# Patient Record
Sex: Female | Born: 1980 | Race: White | Hispanic: No | Smoking: Never smoker
Health system: Southern US, Community
[De-identification: ages and names within clinical notes are randomized; demographics above are authoritative.]

## PROBLEM LIST (undated history)

## (undated) DIAGNOSIS — B019 Varicella without complication: Secondary | ICD-10-CM

## (undated) DIAGNOSIS — F419 Anxiety disorder, unspecified: Secondary | ICD-10-CM

## (undated) DIAGNOSIS — N390 Urinary tract infection, site not specified: Secondary | ICD-10-CM

## (undated) DIAGNOSIS — T4145XA Adverse effect of unspecified anesthetic, initial encounter: Secondary | ICD-10-CM

## (undated) DIAGNOSIS — Z87442 Personal history of urinary calculi: Secondary | ICD-10-CM

## (undated) DIAGNOSIS — N200 Calculus of kidney: Secondary | ICD-10-CM

## (undated) DIAGNOSIS — T8859XA Other complications of anesthesia, initial encounter: Secondary | ICD-10-CM

## (undated) DIAGNOSIS — J189 Pneumonia, unspecified organism: Secondary | ICD-10-CM

## (undated) DIAGNOSIS — K219 Gastro-esophageal reflux disease without esophagitis: Secondary | ICD-10-CM

## (undated) HISTORY — DX: Gastro-esophageal reflux disease without esophagitis: K21.9

## (undated) HISTORY — DX: Calculus of kidney: N20.0

## (undated) HISTORY — DX: Urinary tract infection, site not specified: N39.0

## (undated) HISTORY — PX: APPENDECTOMY: SHX54

## (undated) HISTORY — DX: Varicella without complication: B01.9

---
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
## 2008-04-07 ENCOUNTER — Emergency Department (HOSPITAL_COMMUNITY): Admission: EM | Admit: 2008-04-07 | Discharge: 2008-04-08 | Payer: Self-pay | Admitting: Emergency Medicine

## 2011-03-26 LAB — DIFFERENTIAL
Basophils Absolute: 0
Basophils Relative: 1
Eosinophils Absolute: 0.1
Eosinophils Relative: 1
Lymphocytes Relative: 33
Lymphs Abs: 2.5
Monocytes Absolute: 0.5
Monocytes Relative: 6
Neutro Abs: 4.4
Neutrophils Relative %: 58

## 2011-03-26 LAB — CBC
HCT: 39.7
Hemoglobin: 12.9
MCHC: 32.5
MCV: 82.3
Platelets: 236
RBC: 4.82
RDW: 13.4
WBC: 7.5

## 2011-03-26 LAB — URINALYSIS, ROUTINE W REFLEX MICROSCOPIC
Bilirubin Urine: NEGATIVE
Glucose, UA: NEGATIVE
Ketones, ur: NEGATIVE
Leukocytes, UA: NEGATIVE
Nitrite: NEGATIVE
Protein, ur: NEGATIVE
Specific Gravity, Urine: 1.025
Urobilinogen, UA: 1
pH: 6

## 2011-03-26 LAB — POCT I-STAT, CHEM 8
BUN: 17
Calcium, Ion: 1.18
Chloride: 106
Creatinine, Ser: 0.8
Glucose, Bld: 92
HCT: 42
Hemoglobin: 14.3
Potassium: 4.2
Sodium: 141
TCO2: 27

## 2011-03-26 LAB — URINE MICROSCOPIC-ADD ON

## 2011-03-26 LAB — PREGNANCY, URINE: Preg Test, Ur: NEGATIVE

## 2016-04-05 ENCOUNTER — Ambulatory Visit: Payer: Self-pay | Admitting: Primary Care

## 2016-04-08 ENCOUNTER — Ambulatory Visit (INDEPENDENT_AMBULATORY_CARE_PROVIDER_SITE_OTHER): Payer: Commercial Managed Care - PPO | Admitting: Primary Care

## 2016-04-08 ENCOUNTER — Encounter: Payer: Self-pay | Admitting: Primary Care

## 2016-04-08 VITALS — BP 104/64 | HR 78 | Temp 98.4°F | Ht 63.0 in | Wt 105.1 lb

## 2016-04-08 DIAGNOSIS — K228 Other specified diseases of esophagus: Secondary | ICD-10-CM | POA: Diagnosis not present

## 2016-04-08 DIAGNOSIS — K2289 Other specified disease of esophagus: Secondary | ICD-10-CM

## 2016-04-08 MED ORDER — RANITIDINE HCL 150 MG PO TABS: 150.0000 mg | ORAL_TABLET | Freq: Two times a day (BID) | ORAL | 1 refills | Status: DC

## 2016-04-08 NOTE — Assessment & Plan Note (Signed)
Ongoing for years, also with cough and throat fullness. Pain has increased within the last month. Complete evaluation per ENT and Pulmonology which were negative. GI evaluation 3 months ago, no suspicion for GERD.  Based off of the patients presentation, examination, and symptoms GERD is a likely diagnosis. Will trial Zantac 150 mg once to twice daily x 4-12 weeks. Discussed trigger foods for GERD, handout provided. Discussed to refrain from laying down within 2 hours after eating.  If no improvement, then will try PPI. May then need endoscopy for further evaluation if no improvement. No alarm signs.

## 2016-04-08 NOTE — Progress Notes (Signed)
Subjective:    Patient ID: Traci Cooper, female    DOB: 04-17-1981, 35 y.o.   MRN: [id number]  HPI  Traci Cooper is a 35 year old female who presents today to establish care and discuss the problems mentioned below. Will obtain old records. Her last physical was over 1 year ago.   1) GERD: She reports symptoms of cough, esophageal fullness, esophageal pressure for years. Over the past 3-4 weeks she's noticed increased esophageal pain both with and without ingestion of solids and liquids.   She was thought to have asthma at first and was sent to a pulmonologist and then later to ENT. She was trialed on inhalers and cough medication without improvement. She was determined to be negative for asthma. She was also evaluated by a GI specialist in [place] 3 months who didn't believe her symptoms were caused by esophageal reflux.   She's not tried OTC or prescription strength medication for her symptoms. She will experience nausea occasionally, denies vomiting. She had acupuncture done several weeks ago with temporary improvement. She is concerned as she feels as though there "are rocks in my esophagus".  Review of Systems  Constitutional: Negative for fatigue and unexpected weight change.  HENT: Negative for congestion.   Respiratory: Negative for cough, shortness of breath and wheezing.   Gastrointestinal: Positive for nausea. Negative for vomiting.       Epigastric pain, esophageal pain, fullness to throat  Neurological: Negative for weakness.       Past Medical History:  Diagnosis Date  . Chicken pox   . GERD (gastroesophageal reflux disease)   . Kidney stones   . Urinary tract infection      Social History   Social History  . Marital status: Single    Spouse name: N/A  . Number of children: N/A  . Years of education: N/A   Occupational History  . Not on file.   Social History Main Topics  . Smoking status: Never Smoker  . Smokeless tobacco: Not on file  . Alcohol use No    . Drug use: Unknown  . Sexual activity: Not on file   Other Topics Concern  . Not on file   Social History Narrative   Single.   No children.    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    Enjoys walking, Diplomatic Services operational officerphotography.     Past Surgical History:  Procedure Laterality Date  . APPENDECTOMY  1996 or 1997    Family History  Problem Relation Age of Onset  . Stroke Maternal Grandfather   . Hypertension Maternal Grandfather     Allergies  Allergen Reactions  . Erythromycin   . Tetracyclines & Related Nausea And Vomiting    No current outpatient prescriptions on file prior to visit.   No current facility-administered medications on file prior to visit.     BP 104/64   Pulse 78   Temp 98.4 F (36.9 C) (Oral)   Ht 5\' 3"  (1.6 m)   Wt 105 lb 1.9 oz (47.7 kg)   LMP 03/18/2016   SpO2 98%   BMI 18.62 kg/m    Objective:   Physical Exam  Constitutional: She appears well-nourished. She does not appear ill.  Cardiovascular: Normal rate and regular rhythm.   Pulmonary/Chest: Effort normal and breath sounds normal.  Abdominal: Soft. Bowel sounds are normal. There is tenderness in the epigastric area. There is no guarding, no tenderness at McBurney's point and negative Murphy's sign.  Skin: Skin is warm  and dry.          Assessment & Plan:

## 2016-04-08 NOTE — Patient Instructions (Signed)
Start Zantac (ranitidine) 150 mg tablets. Take 1 tablet by mouth once daily for at least weeks. You may increase to 1 tablet twice daily in 4-5 days if little to no improvement.  Please e-mail me if no improvement in your symptoms in 3-4 weeks.   Refrain from lying down within 2 hours after eating. Take a look at the trigger foods below.  Please schedule a physical with me at your earliest convenience. You may also schedule a lab only appointment 3-4 days prior. We will discuss your lab results in detail during your physical.  It was a pleasure to meet you today! Please don't hesitate to call me with any questions. Welcome to [facility]!   Gastroesophageal Reflux Disease, Adult Normally, food travels down the esophagus and stays in the stomach to be digested. However, when a person has gastroesophageal reflux disease (GERD), food and stomach acid move back up into the esophagus. When this happens, the esophagus becomes sore and inflamed. Over time, GERD can create small holes (ulcers) in the lining of the esophagus.  CAUSES This condition is caused by a problem with the muscle between the esophagus and the stomach (lower esophageal sphincter, or LES). Normally, the LES muscle closes after food passes through the esophagus to the stomach. When the LES is weakened or abnormal, it does not close properly, and that allows food and stomach acid to go back up into the esophagus. The LES can be weakened by certain dietary substances, medicines, and medical conditions, including:  Tobacco use.  Pregnancy.  Having a hiatal hernia.  Heavy alcohol use.  Certain foods and beverages, such as coffee, chocolate, onions, and peppermint. RISK FACTORS This condition is more likely to develop in:  People who have an increased body weight.  People who have connective tissue disorders.  People who use NSAID medicines. SYMPTOMS Symptoms of this condition include:  Heartburn.  Difficult or painful  swallowing.  The feeling of having a lump in the throat.  Abitter taste in the mouth.  Bad breath.  Having a large amount of saliva.  Having an upset or bloated stomach.  Belching.  Chest pain.  Shortness of breath or wheezing.  Ongoing (chronic) cough or a night-time cough.  Wearing away of tooth enamel.  Weight loss. Different conditions can cause chest pain. Make sure to see your health care provider if you experience chest pain. DIAGNOSIS Your health care provider will take a medical history and perform a physical exam. To determine if you have mild or severe GERD, your health care provider may also monitor how you respond to treatment. You may also have other tests, including:  An endoscopy toexamine your stomach and esophagus with a small camera.  A test thatmeasures the acidity level in your esophagus.  A test thatmeasures how much pressure is on your esophagus.  A barium swallow or modified barium swallow to show the shape, size, and functioning of your esophagus. TREATMENT The goal of treatment is to help relieve your symptoms and to prevent complications. Treatment for this condition may vary depending on how severe your symptoms are. Your health care provider may recommend:  Changes to your diet.  Medicine.  Surgery. HOME CARE INSTRUCTIONS Diet  Follow a diet as recommended by your health care provider. This may involve avoiding foods and drinks such as:  Coffee and tea (with or without caffeine).  Drinks that containalcohol.  Energy drinks and sports drinks.  Carbonated drinks or sodas.  Chocolate and cocoa.  Peppermint and  mint flavorings.  Garlic and onions.  Horseradish.  Spicy and acidic foods, including peppers, chili powder, curry powder, vinegar, hot sauces, and barbecue sauce.  Citrus fruit juices and citrus fruits, such as oranges, lemons, and limes.  Tomato-based foods, such as red sauce, chili, salsa, and pizza with red  sauce.  Fried and fatty foods, such as donuts, french fries, potato chips, and high-fat dressings.  High-fat meats, such as hot dogs and fatty cuts of red and white meats, such as rib eye steak, sausage, ham, and bacon.  High-fat dairy items, such as whole milk, butter, and cream cheese.  Eat small, frequent meals instead of large meals.  Avoid drinking large amounts of liquid with your meals.  Avoid eating meals during the 2-3 hours before bedtime.  Avoid lying down right after you eat.  Do not exercise right after you eat. General Instructions  Pay attention to any changes in your symptoms.  Take over-the-counter and prescription medicines only as told by your health care provider. Do not take aspirin, ibuprofen, or other NSAIDs unless your health care provider told you to do so.  Do not use any tobacco products, including cigarettes, chewing tobacco, and e-cigarettes. If you need help quitting, ask your health care provider.  Wear loose-fitting clothing. Do not wear anything tight around your waist that causes pressure on your abdomen.  Raise (elevate) the head of your bed 6 inches (15cm).  Try to reduce your stress, such as with yoga or meditation. If you need help reducing stress, ask your health care provider.  If you are overweight, reduce your weight to an amount that is healthy for you. Ask your health care provider for guidance about a safe weight loss goal.  Keep all follow-up visits as told by your health care provider. This is important. SEEK MEDICAL CARE IF:  You have new symptoms.  You have unexplained weight loss.  You have difficulty swallowing, or it hurts to swallow.  You have wheezing or a persistent cough.  Your symptoms do not improve with treatment.  You have a hoarse voice. SEEK IMMEDIATE MEDICAL CARE IF:  You have pain in your arms, neck, jaw, teeth, or back.  You feel sweaty, dizzy, or light-headed.  You have chest pain or shortness of  breath.  You vomit and your vomit looks like blood or coffee grounds.  You faint.  Your stool is bloody or black.  You cannot swallow, drink, or eat.   This information is not intended to replace advice given to you by your health care provider. Make sure you discuss any questions you have with your health care provider.   Document Released: 03/20/2005 Document Revised: 03/01/2015 Document Reviewed: 10/05/2014 Elsevier Interactive Patient Education 2016 [employer].   Food Choices for Gastroesophageal Reflux Disease, Adult When you have gastroesophageal reflux disease (GERD), the foods you eat and your eating habits are very important. Choosing the right foods can help ease the discomfort of GERD. WHAT GENERAL GUIDELINES DO I NEED TO FOLLOW?  Choose fruits, vegetables, whole grains, low-fat dairy products, and low-fat meat, fish, and poultry.  Limit fats such as oils, salad dressings, butter, nuts, and avocado.  Keep a food diary to identify foods that cause symptoms.  Avoid foods that cause reflux. These may be different for different people.  Eat frequent small meals instead of three large meals each day.  Eat your meals slowly, in a relaxed setting.  Limit fried foods.  Cook foods using methods other than frying.  Avoid drinking alcohol.  Avoid drinking large amounts of liquids with your meals.  Avoid bending over or lying down until 2-3 hours after eating. WHAT FOODS ARE NOT RECOMMENDED? The following are some foods and drinks that may worsen your symptoms: Vegetables Tomatoes. Tomato juice. Tomato and spaghetti sauce. Chili peppers. Onion and garlic. Horseradish. Fruits Oranges, grapefruit, and lemon (fruit and juice). Meats High-fat meats, fish, and poultry. This includes hot dogs, ribs, ham, sausage, salami, and bacon. Dairy Whole milk and chocolate milk. Sour cream. Cream. Butter. Ice cream. Cream cheese.  Beverages Coffee and tea, with or without  caffeine. Carbonated beverages or energy drinks. Condiments Hot sauce. Barbecue sauce.  Sweets/Desserts Chocolate and cocoa. Donuts. Peppermint and spearmint. Fats and Oils High-fat foods, including [place] fries and potato chips. Other Vinegar. Strong spices, such as black pepper, white pepper, red pepper, cayenne, curry powder, cloves, ginger, and chili powder. The items listed above may not be a complete list of foods and beverages to avoid. Contact your dietitian for more information.   This information is not intended to replace advice given to you by your health care provider. Make sure you discuss any questions you have with your health care provider.   Document Released: 06/10/2005 Document Revised: 07/01/2014 Document Reviewed: 04/14/2013 Elsevier Interactive Patient Education [employer]

## 2016-04-08 NOTE — Progress Notes (Signed)
Pre visit review using our clinic review tool, if applicable. No additional management support is needed unless otherwise documented below in the visit note. 

## 2016-04-15 ENCOUNTER — Telehealth: Payer: Self-pay | Admitting: Primary Care

## 2016-04-15 NOTE — Telephone Encounter (Signed)
-----   Message from Doreene NestKatherine K Brylie Sneath, NP sent at 04/08/2016  3:51 PM EDT ----- Regarding: GERD Any improvement in symptoms since starting Zantac (ranitidine)?

## 2016-04-15 NOTE — Telephone Encounter (Signed)
Message left for patient to return my call.  

## 2016-04-18 NOTE — Telephone Encounter (Signed)
Message left for patient to return my call.  

## 2016-05-03 ENCOUNTER — Telehealth: Payer: Self-pay | Admitting: Primary Care

## 2016-05-03 ENCOUNTER — Encounter: Payer: Self-pay | Admitting: Primary Care

## 2016-05-03 ENCOUNTER — Ambulatory Visit (INDEPENDENT_AMBULATORY_CARE_PROVIDER_SITE_OTHER): Payer: Commercial Managed Care - PPO | Admitting: Primary Care

## 2016-05-03 VITALS — BP 108/70 | HR 105 | Temp 98.3°F | Ht 63.0 in | Wt 107.1 lb

## 2016-05-03 DIAGNOSIS — K228 Other specified diseases of esophagus: Secondary | ICD-10-CM

## 2016-05-03 DIAGNOSIS — Z0000 Encounter for general adult medical examination without abnormal findings: Secondary | ICD-10-CM

## 2016-05-03 DIAGNOSIS — K2289 Other specified disease of esophagus: Secondary | ICD-10-CM

## 2016-05-03 LAB — LIPID PANEL
Cholesterol: 219 mg/dL — ABNORMAL HIGH (ref 0–200)
HDL: 81.2 mg/dL (ref >39.00)
LDL Cholesterol: 126 mg/dL — ABNORMAL HIGH (ref 0–99)
NonHDL: 137.97
Total CHOL/HDL Ratio: 3
Triglycerides: 58 mg/dL (ref 0.0–149.0)
VLDL: 11.6 mg/dL (ref 0.0–40.0)

## 2016-05-03 LAB — COMPREHENSIVE METABOLIC PANEL
ALT: 10 U/L (ref 0–35)
AST: 12 U/L (ref 0–37)
Albumin: 4.5 g/dL (ref 3.5–5.2)
Alkaline Phosphatase: 38 U/L — ABNORMAL LOW (ref 39–117)
BUN: 17 mg/dL (ref 6–23)
CO2: 30 mEq/L (ref 19–32)
Calcium: 9.6 mg/dL (ref 8.4–10.5)
Chloride: 105 mEq/L (ref 96–112)
Creatinine, Ser: 0.62 mg/dL (ref 0.40–1.20)
GFR: 116.62 mL/min (ref >60.00)
Glucose, Bld: 98 mg/dL (ref 70–99)
Potassium: 4.5 mEq/L (ref 3.5–5.1)
Sodium: 141 mEq/L (ref 135–145)
Total Bilirubin: 0.4 mg/dL (ref 0.2–1.2)
Total Protein: 7.4 g/dL (ref 6.0–8.3)

## 2016-05-03 NOTE — Assessment & Plan Note (Signed)
Improved with Zantac, although continues to experience belching, esophageal pain/burning.  Will try short term PPI x4-6 weeks. Will check on patient at that time.

## 2016-05-03 NOTE — Progress Notes (Signed)
Pre visit review using our clinic review tool, if applicable. No additional management support is needed unless otherwise documented below in the visit note. 

## 2016-05-03 NOTE — Telephone Encounter (Signed)
Noted. Please have patient take omeprazole 20 mg daily for 4 weeks. We will call and check in on her at that time.

## 2016-05-03 NOTE — Progress Notes (Signed)
Subjective:    Patient ID: Traci Cooper, female    DOB: 1981-05-22, 35 y.o.   MRN: [id number]  HPI  [name] is a 35 year old female who presents today for complete physical.  Immunizations: -Tetanus: Unsure, would like to check her records at home. -Influenza: Declines   Diet: She endorses a healthy diet. Breakfast: Smoothie Lunch: Veggie burger, pasta, vegetables Dinner: Fast food, restaurants, frozen dinner Snacks: Yogurt, cheese, nuts, fruit Desserts: Occasionally Beverages: Water, occasional hot tea  Exercise: She does not currently exercise. Eye exam: Completed 1.5 years ago Dental exam: Completes semi-annually Pap Smear: Completed 2 years ago. Normal.     Review of Systems  Constitutional: Negative for unexpected weight change.  HENT: Negative for rhinorrhea.   Respiratory: Negative for cough and shortness of breath.   Cardiovascular: Negative for chest pain.  Gastrointestinal: Negative for constipation and diarrhea.       GERD symptoms, overall improved  Genitourinary: Negative for difficulty urinating and menstrual problem.  Musculoskeletal: Negative for arthralgias and myalgias.  Skin: Negative for rash.  Allergic/Immunologic: Negative for environmental allergies.  Neurological: Negative for dizziness, numbness and headaches.  Psychiatric/Behavioral:       Denies concerns for anxiety or depression.       Past Medical History:  Diagnosis Date  . Chicken pox   . GERD (gastroesophageal reflux disease)   . Kidney stones   . Urinary tract infection      Social History   Social History  . Marital status: Single    Spouse name: N/A  . Number of children: N/A  . Years of education: N/A   Occupational History  . Not on file.   Social History Main Topics  . Smoking status: Never Smoker  . Smokeless tobacco: Not on file  . Alcohol use No  . Drug use: Unknown  . Sexual activity: Not on file   Other Topics Concern  . Not on file   Social  History Narrative   Single.   No children.    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    Enjoys walking, Diplomatic Services operational officerphotography.     Past Surgical History:  Procedure Laterality Date  . APPENDECTOMY  1996 or 1997    Family History  Problem Relation Age of Onset  . Stroke Maternal Grandfather   . Hypertension Maternal Grandfather     Allergies  Allergen Reactions  . Erythromycin   . Tetracyclines & Related Nausea And Vomiting    Current Outpatient Prescriptions on File Prior to Visit  Medication Sig Dispense Refill  . ranitidine (ZANTAC) 150 MG tablet Take 1 tablet (150 mg total) by mouth 2 (two) times daily. 60 tablet 1   No current facility-administered medications on file prior to visit.     BP 108/70   Pulse (!) 105   Temp 98.3 F (36.8 C) (Oral)   Ht 5\' 3"  (1.6 m)   Wt 107 lb 1.9 oz (48.6 kg)   LMP 03/18/2016   SpO2 95%   BMI 18.98 kg/m    Objective:   Physical Exam  Constitutional: She is oriented to person, place, and time. She appears well-nourished.  HENT:  Right Ear: Tympanic membrane and ear canal normal.  Left Ear: Tympanic membrane and ear canal normal.  Nose: Nose normal.  Mouth/Throat: Oropharynx is clear and moist.  Eyes: Conjunctivae and EOM are normal. Pupils are equal, round, and reactive to light.  Neck: Neck supple. No thyromegaly present.  Cardiovascular: Normal rate and regular  rhythm.   No murmur heard. Pulmonary/Chest: Effort normal and breath sounds normal. She has no rales.  Dry cough on exam  Abdominal: Soft. Bowel sounds are normal. There is no tenderness.  Musculoskeletal: Normal range of motion.  Lymphadenopathy:    She has no cervical adenopathy.  Neurological: She is alert and oriented to person, place, and time. She has normal reflexes. No cranial nerve deficit.  Skin: Skin is warm and dry. No rash noted.  Psychiatric: She has a normal mood and affect.          Assessment & Plan:

## 2016-05-03 NOTE — Patient Instructions (Addendum)
Stop ranitidine tablets for acid reflux. Start omeprazole 20 mg tablets for acid reflux. Take 1 tablet by mouth every day for 4 weeks.  I will call to check on you in 1 month.  Reduce fast food, processed food. Increase vegetables, fruit, whole grains.  Start exercising. You should be getting 150 minutes of moderate intensity exercise weekly.  Ensure you are consuming 64 ounces of water daily.  Follow up in 1 year for annual physical or sooner if needed.  It was a pleasure to see you today!  Food Choices for Gastroesophageal Reflux Disease, Adult When you have gastroesophageal reflux disease (GERD), the foods you eat and your eating habits are very important. Choosing the right foods can help ease the discomfort of GERD. WHAT GENERAL GUIDELINES DO I NEED TO FOLLOW?  Choose fruits, vegetables, whole grains, low-fat dairy products, and low-fat meat, fish, and poultry.  Limit fats such as oils, salad dressings, butter, nuts, and avocado.  Keep a food diary to identify foods that cause symptoms.  Avoid foods that cause reflux. These may be different for different people.  Eat frequent small meals instead of three large meals each day.  Eat your meals slowly, in a relaxed setting.  Limit fried foods.  Cook foods using methods other than frying.  Avoid drinking alcohol.  Avoid drinking large amounts of liquids with your meals.  Avoid bending over or lying down until 2-3 hours after eating. WHAT FOODS ARE NOT RECOMMENDED? The following are some foods and drinks that may worsen your symptoms: Vegetables Tomatoes. Tomato juice. Tomato and spaghetti sauce. Chili peppers. Onion and garlic. Horseradish. Fruits Oranges, grapefruit, and lemon (fruit and juice). Meats High-fat meats, fish, and poultry. This includes hot dogs, ribs, ham, sausage, salami, and bacon. Dairy Whole milk and chocolate milk. Sour cream. Cream. Butter. Ice cream. Cream cheese.  Beverages Coffee and tea,  with or without caffeine. Carbonated beverages or energy drinks. Condiments Hot sauce. Barbecue sauce.  Sweets/Desserts Chocolate and cocoa. Donuts. Peppermint and spearmint. Fats and Oils High-fat foods, including [place] fries and potato chips. Other Vinegar. Strong spices, such as black pepper, white pepper, red pepper, cayenne, curry powder, cloves, ginger, and chili powder. The items listed above may not be a complete list of foods and beverages to avoid. Contact your dietitian for more information.   This information is not intended to replace advice given to you by your health care provider. Make sure you discuss any questions you have with your health care provider.   Document Released: 06/10/2005 Document Revised: 07/01/2014 Document Reviewed: 04/14/2013 Elsevier Interactive Patient Education [employer]

## 2016-05-03 NOTE — Telephone Encounter (Signed)
Message left for patient to return my call.  

## 2016-05-03 NOTE — Assessment & Plan Note (Addendum)
She will check records for tetanus. Declines influenza. Pap UTD, due in 2018. Discussed the importance of a healthy diet and regular exercise.  Exam unremarkable. Labs pending. Will complete form. Follow up in 1 year for repeat physical.

## 2016-05-03 NOTE — Telephone Encounter (Signed)
At check out pt stated she took omeprazole 20mg   For 14 days  In feb 2017

## 2016-05-03 NOTE — Telephone Encounter (Signed)
Patient returned Chan's call.  I let patient know Kate's comments and she voiced understanding.

## 2016-05-06 ENCOUNTER — Encounter: Payer: Self-pay | Admitting: *Deleted

## 2016-06-02 ENCOUNTER — Telehealth: Payer: Self-pay | Admitting: Primary Care

## 2016-06-02 NOTE — Telephone Encounter (Signed)
-----   Message from Doreene NestKatherine K Clark, NP sent at 05/03/2016  7:58 AM EST ----- Regarding: GERD How's she doing since we initiated omeprazole 20 mg for acid reflux?

## 2016-06-04 NOTE — Telephone Encounter (Signed)
Message left for patient to return my call.  

## 2016-06-12 NOTE — Telephone Encounter (Signed)
Message left for patient to return my call.  

## 2016-09-05 ENCOUNTER — Encounter: Payer: Self-pay | Admitting: Primary Care

## 2016-09-05 ENCOUNTER — Encounter (INDEPENDENT_AMBULATORY_CARE_PROVIDER_SITE_OTHER): Payer: Self-pay

## 2016-09-05 ENCOUNTER — Ambulatory Visit (INDEPENDENT_AMBULATORY_CARE_PROVIDER_SITE_OTHER): Payer: Commercial Managed Care - PPO | Admitting: Primary Care

## 2016-09-05 VITALS — BP 106/74 | HR 88 | Temp 98.4°F | Ht 63.0 in | Wt 104.8 lb

## 2016-09-05 DIAGNOSIS — R6889 Other general symptoms and signs: Secondary | ICD-10-CM

## 2016-09-05 DIAGNOSIS — B9789 Other viral agents as the cause of diseases classified elsewhere: Secondary | ICD-10-CM

## 2016-09-05 DIAGNOSIS — J069 Acute upper respiratory infection, unspecified: Secondary | ICD-10-CM

## 2016-09-05 LAB — POCT RAPID STREP A (OFFICE): Rapid Strep A Screen: NEGATIVE

## 2016-09-05 LAB — POC INFLUENZA A&B (BINAX/QUICKVUE)
Influenza A, POC: NEGATIVE
Influenza B, POC: NEGATIVE

## 2016-09-05 NOTE — Patient Instructions (Signed)
You do not have strep throat or the flu which is good news.  Sore Throat/Fevers: You may take ibuprofen 600 mg every 8 hours as needed for pain and inflammation.  Cough: Try Delsym or Robitussin, these may be purchased over the counter.  Your symptoms should resolve on their own with time.  It was a pleasure to see you today!   Upper Respiratory Infection, Adult Most upper respiratory infections (URIs) are a viral infection of the air passages leading to the lungs. A URI affects the nose, throat, and upper air passages. The most common type of URI is nasopharyngitis and is typically referred to as "the common cold." URIs run their course and usually go away on their own. Most of the time, a URI does not require medical attention, but sometimes a bacterial infection in the upper airways can follow a viral infection. This is called a secondary infection. Sinus and middle ear infections are common types of secondary upper respiratory infections. Bacterial pneumonia can also complicate a URI. A URI can worsen asthma and chronic obstructive pulmonary disease (COPD). Sometimes, these complications can require emergency medical care and may be life threatening. What are the causes? Almost all URIs are caused by viruses. A virus is a type of germ and can spread from one person to another. What increases the risk? You may be at risk for a URI if:  You smoke.  You have chronic heart or lung disease.  You have a weakened defense (immune) system.  You are very young or very old.  You have nasal allergies or asthma.  You work in crowded or poorly ventilated areas.  You work in health care facilities or schools. What are the signs or symptoms? Symptoms typically develop 2-3 days after you come in contact with a cold virus. Most viral URIs last 7-10 days. However, viral URIs from the influenza virus (flu virus) can last 14-18 days and are typically more severe. Symptoms may include:  Runny or  stuffy (congested) nose.  Sneezing.  Cough.  Sore throat.  Headache.  Fatigue.  Fever.  Loss of appetite.  Pain in your forehead, behind your eyes, and over your cheekbones (sinus pain).  Muscle aches. How is this diagnosed? Your health care provider may diagnose a URI by:  Physical exam.  Tests to check that your symptoms are not due to another condition such as:  Strep throat.  Sinusitis.  Pneumonia.  Asthma. How is this treated? A URI goes away on its own with time. It cannot be cured with medicines, but medicines may be prescribed or recommended to relieve symptoms. Medicines may help:  Reduce your fever.  Reduce your cough.  Relieve nasal congestion. Follow these instructions at home:  Take medicines only as directed by your health care provider.  Gargle warm saltwater or take cough drops to comfort your throat as directed by your health care provider.  Use a warm mist humidifier or inhale steam from a shower to increase air moisture. This may make it easier to breathe.  Drink enough fluid to keep your urine clear or pale yellow.  Eat soups and other clear broths and maintain good nutrition.  Rest as needed.  Return to work when your temperature has returned to normal or as your health care provider advises. You may need to stay home longer to avoid infecting others. You can also use a face mask and careful hand washing to prevent spread of the virus.  Increase the usage of your inhaler  if you have asthma.  Do not use any tobacco products, including cigarettes, chewing tobacco, or electronic cigarettes. If you need help quitting, ask your health care provider. How is this prevented? The best way to protect yourself from getting a cold is to practice good hygiene.  Avoid oral or hand contact with people with cold symptoms.  Wash your hands often if contact occurs. There is no clear evidence that vitamin C, vitamin E, echinacea, or exercise reduces  the chance of developing a cold. However, it is always recommended to get plenty of rest, exercise, and practice good nutrition. Contact a health care provider if:  You are getting worse rather than better.  Your symptoms are not controlled by medicine.  You have chills.  You have worsening shortness of breath.  You have brown or red mucus.  You have yellow or brown nasal discharge.  You have pain in your face, especially when you bend forward.  You have a fever.  You have swollen neck glands.  You have pain while swallowing.  You have white areas in the back of your throat. Get help right away if:  You have severe or persistent:  Headache.  Ear pain.  Sinus pain.  Chest pain.  You have chronic lung disease and any of the following:  Wheezing.  Prolonged cough.  Coughing up blood.  A change in your usual mucus.  You have a stiff neck.  You have changes in your:  Vision.  Hearing.  Thinking.  Mood. This information is not intended to replace advice given to you by your health care provider. Make sure you discuss any questions you have with your health care provider. Document Released: 12/04/2000 Document Revised: 02/11/2016 Document Reviewed: 09/15/2013 Elsevier Interactive Patient Education  2017 [employer].

## 2016-09-05 NOTE — Progress Notes (Signed)
Pre visit review using our clinic review tool, if applicable. No additional management support is needed unless otherwise documented below in the visit note. 

## 2016-09-05 NOTE — Progress Notes (Signed)
Subjective:    Patient ID: Traci Cooper, female    DOB: 01-Oct-1980, 36 y.o.   MRN: [id number]  HPI  Traci Cooper is a 36 year old female who presents today with a chief complaint of flu-like symptoms. She reports fevers (101.2 last night), sore throat, cough, nasal congestion. She did notice white puss to the back of her throat yesterday after scraping with a q-tip. She did not get a flu shot last season. Her symptoms began with a scratchy throat last week that improved with warm tea and flu packets. Three days ago her symptoms returned with sore throat and now cough that began yesterday. She's taken OTC cough syrup, cough drops, cough and flu packets with some improvement. She denies fevers today, sick contacts.   Review of Systems  Constitutional: Positive for fatigue and fever.  HENT: Positive for congestion and sore throat. Negative for ear pain and sinus pressure.   Respiratory: Positive for cough. Negative for shortness of breath.   Cardiovascular: Negative for chest pain.       Past Medical History:  Diagnosis Date  . Chicken pox   . GERD (gastroesophageal reflux disease)   . Kidney stones   . Urinary tract infection      Social History   Social History  . Marital status: Single    Spouse name: N/A  . Number of children: N/A  . Years of education: N/A   Occupational History  . Not on file.   Social History Main Topics  . Smoking status: Never Smoker  . Smokeless tobacco: Never Used  . Alcohol use No  . Drug use: Unknown  . Sexual activity: Not on file   Other Topics Concern  . Not on file   Social History Narrative   Single.   No children.    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    Enjoys walking, Diplomatic Services operational officerphotography.     Past Surgical History:  Procedure Laterality Date  . APPENDECTOMY  1996 or 1997    Family History  Problem Relation Age of Onset  . Stroke Maternal Grandfather   . Hypertension Maternal Grandfather     Allergies  Allergen Reactions  .  Erythromycin   . Tetracyclines & Related Nausea And Vomiting    No current outpatient prescriptions on file prior to visit.   No current facility-administered medications on file prior to visit.     BP 106/74   Pulse 88   Temp 98.4 F (36.9 C) (Oral)   Ht 5\' 3"  (1.6 m)   Wt 104 lb 12.8 oz (47.5 kg)   LMP 08/28/2016   SpO2 99%   BMI 18.56 kg/m    Objective:   Physical Exam  Constitutional: She appears well-nourished. She does not appear ill.  HENT:  Right Ear: Tympanic membrane and ear canal normal.  Left Ear: Tympanic membrane and ear canal normal.  Nose: Right sinus exhibits no maxillary sinus tenderness and no frontal sinus tenderness. Left sinus exhibits no maxillary sinus tenderness and no frontal sinus tenderness.  Mouth/Throat: Posterior oropharyngeal erythema present. No oropharyngeal exudate or posterior oropharyngeal edema.  Eyes: Conjunctivae are normal.  Neck: Neck supple.  Cardiovascular: Normal rate and regular rhythm.   Pulmonary/Chest: Effort normal and breath sounds normal. She has no wheezes. She has no rales.  Lymphadenopathy:    She has no cervical adenopathy.  Skin: Skin is warm and dry.          Assessment & Plan:  Sore Throat:  Present x 3 days, also with cough, fevers.  No fever today. Improvement in symptoms temporarily with OTC treatment. Exam today with clear lungs, afebrile, no exudate to posterior pharynx. Rapid Flu: Negative Rapid Strep: Negative Discussed viral cause and to continue with symptomatic treatment OTC. Flonase, Ibuprofen, Delsym. Return precautions provided.   Traci Sheldon, NP

## 2016-09-05 NOTE — Addendum Note (Signed)
Addended by: Tawnya CrookSAMBATH, Alban Marucci on: 09/05/2016 12:20 PM   Modules accepted: Orders

## 2016-09-30 ENCOUNTER — Telehealth: Payer: Self-pay | Admitting: *Deleted

## 2016-09-30 NOTE — Telephone Encounter (Signed)
Agree, needs office visit for an antidepressant evaluation.

## 2016-09-30 NOTE — Telephone Encounter (Signed)
Pt contacted office requesting a refill of Cymbalta. Med not on current med list and pt states she has not been on medication for years. Spoke to [facility] who states pt will require an OV to restart. Advised pt who is not wanting to schedule OV and will contact previous provider.

## 2017-05-13 ENCOUNTER — Ambulatory Visit: Payer: Commercial Managed Care - PPO | Admitting: Family Medicine

## 2017-05-13 ENCOUNTER — Encounter: Payer: Self-pay | Admitting: Family Medicine

## 2017-05-13 ENCOUNTER — Ambulatory Visit: Payer: Self-pay | Admitting: *Deleted

## 2017-05-13 VITALS — BP 106/64 | HR 85 | Temp 98.9°F | Wt 101.0 lb

## 2017-05-13 DIAGNOSIS — F5102 Adjustment insomnia: Secondary | ICD-10-CM

## 2017-05-13 DIAGNOSIS — F331 Major depressive disorder, recurrent, moderate: Secondary | ICD-10-CM

## 2017-05-13 MED ORDER — ZOLPIDEM TARTRATE 10 MG PO TABS: 10.0000 mg | ORAL_TABLET | Freq: Every evening | ORAL | 1 refills | Status: DC | PRN

## 2017-05-13 MED ORDER — DULOXETINE HCL 20 MG PO CPEP: 20.0000 mg | ORAL_CAPSULE | Freq: Every day | ORAL | 3 refills | Status: DC

## 2017-05-13 NOTE — Telephone Encounter (Signed)
Patient has tried a sleep aid for 2-3 days and she was drowsy at work. She does take melatonin every night. She has tried exercise and running as well.  Reason for Disposition . Insomnia interferes with work or school  Answer Assessment - Initial Assessment Questions 1. DESCRIPTION: "Tell me about your sleeping problem."      Trouble sleeping for over 1 month- she is getting worse- didn't sleep 2 hours last night 2. ONSET: "How long have you been having trouble sleeping?" (e.g., days, weeks, months)     Over 1 month 3. RECURRENT: "Have you had sleeping problems before?"  If yes: "What happened that time?" "What helped your sleeping problem go away in the past?"      Occasional is under stress- but never this long. 4. STRESS: "Is there anything in your life that is making you feel stressed or tense?"     Patient believes this is stress related 5. PAIN: "Do you have any pain that is keeping you awake?" (e.g., back pain, headache, abdominal pain)     no 6. CAFFEINE ABUSE: "Do you drink caffeinated beverages, and how much each day?" (e.g., coffee, tea, colas)     Only in morning 7. SUBSTANCE ABUSE: "Do you use any illegal drugs or alcohol?"     no 8. OTHER SYMPTOMS: "Do you have any other symptoms?"  (e.g., difficulty breathing)     Patient has acid reflux and that has been worse recently, patient felt chills on the back of her head last night- but that normally does not happen.  Protocols used: INSOMNIA-A-AH

## 2017-05-13 NOTE — Progress Notes (Signed)
Subjective   CC:  Chief Complaint  Patient presents with  . Insomnia    due to stree "alot on her mind", tried multiple OTC med    HPI: Traci Cooper is a 36 y.o. female who presents to the office today to address the problems listed above in the chief complaint.  This is a 36 year old new patient to me who presents for same-day visit because her primary care provider is not available.  She is here due to worsening insomnia  She reports that over the last 1-2 months she has been unable to sleep well and over the last week she has been barely able to get 1-2 hours per night.  Prior to several months ago she had been a good sleeper, however, she currently is under a lot of stress.  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  She also commutes 45 minutes to 50 minutes each way to and from work.  She feels she is handling the stress well and her mood and fatigue are not affecting her performance at work.  However she also is undergoing difficulties in her long-term committed relationship.  She has been with her current partner for about 7 years and is now wanting to end the relationship.  She is a bit emotional about this.  She reports that she is safe in the home and there are no alcohol or substance abuse problems.  The relationship is just no longer in her best interest.  Her worry and concern over her strategy for leaving the relationship are keeping her up at night.  She has tried over-the-counter sedatives but they left her groggy during the daytime.  She has never been on prescription strength sedatives in the past.  She denies physical complaints such as shortness of breath snoring palpitations tremors chest pain.  Her past medical history is significant for depression-she was treated well with Cymbalta about 8 years ago.  She took this for 1-2 years and stopped after her depression remitted.  She does endorse history  of social anxiety disorder.  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  She and her partner whenever able to become pregnant.  She has been evaluated in the past feels that she is now ready to move on from this topic.  However she does have some grief regarding the issue. [place] in [place]  She does have a few friends that are supportive in the area but not a large support system. She is a high functioning adult. I reviewed the patients updated PMH, FH, and SocHx.    Patient Active Problem List   Diagnosis Date Noted  . Preventative health care 05/03/2016  . Esophageal pain 04/08/2016   Current Meds  Medication Sig  . Melatonin-Pyridoxine (MELATIN PO) Take 1 tablet by mouth at bedtime as needed.    Review of Systems: Constitutional: Negative for fever malaise or anorexia Cardiovascular: negative for chest pain Respiratory: negative for SOB or persistent  cough Gastrointestinal: negative for abdominal pain  Objective  Vitals: BP 106/64 (BP Location: Left Arm, Patient Position: Sitting, Cuff Size: Small)   Pulse 85   Temp 98.9 F (37.2 C) (Oral)   Wt 101 lb (45.8 kg)   LMP 04/29/2017   SpO2 98%   BMI 17.89 kg/m  General: no acute distress, appears tired, thin Psych:  Alert and oriented, good insight, good eye contact, normal judgment and speech. Neurologic:   Mental status is normal. normal gait  Assessment  1. Adjustment insomnia   2. Moderate episode of recurrent major depressive disorder (HCC)      Plan   20 minutes of this 25-minute  office visit was spent counseling.  We discussed her diagnosis of insomnia and depression, its etiology and prognosis, treatment options and formulated in individual care plan as documented below.  Patient insomnia is related to adjustment reaction and secondary to depression.  I recommend starting short-term Ambien to help restore her from her sleep deprivation that is now chronic.  We discussed risks, benefits and appropriate use.  We discussed proper sleep hygiene.  She will follow-up with her primary care provider to assess her insomnia in 3-4 weeks  Recurrent moderate depression-restart Cymbalta.  As this has worked for her in the past I believe it is a good choice.  We discussed onset of symptom resolution, possible side effects versus risks.  There are specific instructions in her handout.  Treating her depression will be important to help her be able to manage ending her relationship if that is what she decides to do.  Patient agrees with this care plan.  Follow up: Return in about 4 weeks (around 06/10/2017) for follow up depression and insomnia.    Commons side effects, risks, benefits, and alternatives for medications and treatment plan prescribed today were discussed, and the patient expressed understanding of the given instructions. Patient is instructed to call or message via MyChart if he/she has any questions or concerns regarding our treatment plan. No barriers to understanding were identified. We discussed Red Flag symptoms and signs in detail. Patient expressed understanding regarding what to do in case of urgent or emergency type symptoms.   Medication list was reconciled, printed and provided to the patient in AVS. Patient instructions and summary information was reviewed with the patient as documented in the AVS. This note was prepared with assistance of Dragon voice recognition software. Occasional wrong-word or sound-a-like substitutions may have occurred due to the inherent  limitations of voice recognition software  No orders of the defined types were placed in this encounter.  Meds ordered this encounter  Medications  . Melatonin-Pyridoxine (MELATIN PO)    Sig: Take 1 tablet by mouth at bedtime as needed.  . DULoxetine (CYMBALTA) 20 MG capsule    Sig: Take 1 capsule (20 mg total) by mouth daily.    Dispense:  30 capsule    Refill:  3  . zolpidem (AMBIEN) 10 MG tablet    Sig: Take 1 tablet (10 mg total) by mouth at bedtime as needed for sleep.    Dispense:  15 tablet    Refill:  1

## 2017-05-13 NOTE — Patient Instructions (Addendum)
Please look up "sleep hygiene" for ideas to improve sleep.    When you're feeling too down to do anything, try these 10 Little things!  Take a shower. Even if you plan to stay in all day long and not see a soul, take a shower. It takes the most effort to hop in to the shower but once you do, you'll feel immediate results. It will wake you up and you'll be feeling much fresher (and cleaner too).  Brush and floss your teeth. Give your teeth a good brushing with a floss finish. It's a small task but it feels so good and you can check 'taking care of your health' off the list of things to do.  Do something small on your list. Most of us have some small thing we would like to get done (load of laundry, sew a button, email a friend). Doing one of these things will make you feel like you've accomplished something.  Drink water. Drinking water is easy right? It's also really beneficial for your health so keep a glass beside you all day and take sips often. It gives you energy and prevents you from boredom eating.  Do some floor exercises. The last thing you want to do is exercise but it might be just the thing you need the most. Keep it simple and do exercises that involve sitting or laying on the floor. Even the smallest of exercises release chemicals in the brain that make you feel good. Yoga stretches or core exercises are going to make you feel good with minimal effort.  Make your bed. Making your bed takes a few minutes but it's productive and you'll feel relieved when it's done. An unmade bed is a huge visual reminder that you're having an unproductive day. Do it and consider it your housework for the day.  Put on some nice clothes. Take the sweatpants off even if you don't plan to go anywhere. Put on clothes that make you feel good. Take a look in the mirror so your brain recognizes the sweatpants have been replaced with clothes that make you look great. It's an instant confidence  booster.  Wash the dishes. A pile of dirty dishes in the sink is a reflection of your mood. It's possible that if you wash up the dishes, your mood will follow suit. It's worth a try.  Cook a real meal. If you have the luxury to have a "do nothing" day, you have time to make a real meal for yourself. Make a meal that you love to eat. The process is good to get you out of the funk and the food will ensure you have more energy for tomorrow.  Write out your thoughts by hand. When you hand write, you stimulate your brain to focus on the moment that you're in so make yourself comfortable and write whatever comes into your mind. Put those thoughts out on paper so they stop spinning around in your head. Those thoughts might be the very thing holding you down.  Insomnia Insomnia is a sleep disorder that makes it difficult to fall asleep or to stay asleep. Insomnia can cause tiredness (fatigue), low energy, difficulty concentrating, mood swings, and poor performance at work or school. There are three different ways to classify insomnia:  Difficulty falling asleep.  Difficulty staying asleep.  Waking up too early in the morning.  Any type of insomnia can be long-term (chronic) or short-term (acute). Both are common. Short-term insomnia usually lasts for three  months or less. Chronic insomnia occurs at least three times a week for longer than three months. What are the causes? Insomnia may be caused by another condition, situation, or substance, such as:  Anxiety.  Certain medicines.  Gastroesophageal reflux disease (GERD) or other gastrointestinal conditions.  Asthma or other breathing conditions.  Restless legs syndrome, sleep apnea, or other sleep disorders.  Chronic pain.  Menopause. This may include hot flashes.  Stroke.  Abuse of alcohol, tobacco, or illegal drugs.  Depression.  Caffeine.  Neurological disorders, such as Alzheimer disease.  An overactive thyroid  (hyperthyroidism).  The cause of insomnia may not be known. What increases the risk? Risk factors for insomnia include:  Gender. Women are more commonly affected than men.  Age. Insomnia is more common as you get older.  Stress. This may involve your professional or personal life.  Income. Insomnia is more common in people with lower income.  Lack of exercise.  Irregular work schedule or night shifts.  Traveling between different time zones.  What are the signs or symptoms? If you have insomnia, trouble falling asleep or trouble staying asleep is the main symptom. This may lead to other symptoms, such as:  Feeling fatigued.  Feeling nervous about going to sleep.  Not feeling rested in the morning.  Having trouble concentrating.  Feeling irritable, anxious, or depressed.  How is this treated? Treatment for insomnia depends on the cause. If your insomnia is caused by an underlying condition, treatment will focus on addressing the condition. Treatment may also include:  Medicines to help you sleep.  Counseling or therapy.  Lifestyle adjustments.  Follow these instructions at home:  Take medicines only as directed by your health care provider.  Keep regular sleeping and waking hours. Avoid naps.  Keep a sleep diary to help you and your health care provider figure out what could be causing your insomnia. Include: ? When you sleep. ? When you wake up during the night. ? How well you sleep. ? How rested you feel the next day. ? Any side effects of medicines you are taking. ? What you eat and drink.  Make your bedroom a comfortable place where it is easy to fall asleep: ? Put up shades or special blackout curtains to block light from outside. ? Use a white noise machine to block noise. ? Keep the temperature cool.  Exercise regularly as directed by your health care provider. Avoid exercising right before bedtime.  Use relaxation techniques to manage stress. Ask  your health care provider to suggest some techniques that may work well for you. These may include: ? Breathing exercises. ? Routines to release muscle tension. ? Visualizing peaceful scenes.  Cut back on alcohol, caffeinated beverages, and cigarettes, especially close to bedtime. These can disrupt your sleep.  Do not overeat or eat spicy foods right before bedtime. This can lead to digestive discomfort that can make it hard for you to sleep.  Limit screen use before bedtime. This includes: ? Watching TV. ? Using your smartphone, tablet, and computer.  Stick to a routine. This can help you fall asleep faster. Try to do a quiet activity, brush your teeth, and go to bed at the same time each night.  Get out of bed if you are still awake after 15 minutes of trying to sleep. Keep the lights down, but try reading or doing a quiet activity. When you feel sleepy, go back to bed.  Make sure that you drive carefully. Avoid driving if  you feel very sleepy.  Keep all follow-up appointments as directed by your health care provider. This is important. Contact a health care provider if:  You are tired throughout the day or have trouble in your daily routine due to sleepiness.  You continue to have sleep problems or your sleep problems get worse. Get help right away if:  You have serious thoughts about hurting yourself or someone else. This information is not intended to replace advice given to you by your health care provider. Make sure you discuss any questions you have with your health care provider. Document Released: 06/07/2000 Document Revised: 11/10/2015 Document Reviewed: 03/11/2014 Elsevier Interactive Patient Education  2018 [employer]. Insomnia Insomnia is a sleep disorder that makes it difficult to fall asleep or to stay asleep. Insomnia can cause tiredness (fatigue), low energy, difficulty concentrating, mood swings, and poor performance at work or school. There are three different  ways to classify insomnia:  Difficulty falling asleep.  Difficulty staying asleep.  Waking up too early in the morning.  Any type of insomnia can be long-term (chronic) or short-term (acute). Both are common. Short-term insomnia usually lasts for three months or less. Chronic insomnia occurs at least three times a week for longer than three months. What are the causes? Insomnia may be caused by another condition, situation, or substance, such as:  Anxiety.  Certain medicines.  Gastroesophageal reflux disease (GERD) or other gastrointestinal conditions.  Asthma or other breathing conditions.  Restless legs syndrome, sleep apnea, or other sleep disorders.  Chronic pain.  Menopause. This may include hot flashes.  Stroke.  Abuse of alcohol, tobacco, or illegal drugs.  Depression.  Caffeine.  Neurological disorders, such as Alzheimer disease.  An overactive thyroid (hyperthyroidism).  The cause of insomnia may not be known. What increases the risk? Risk factors for insomnia include:  Gender. Women are more commonly affected than men.  Age. Insomnia is more common as you get older.  Stress. This may involve your professional or personal life.  Income. Insomnia is more common in people with lower income.  Lack of exercise.  Irregular work schedule or night shifts.  Traveling between different time zones.  What are the signs or symptoms? If you have insomnia, trouble falling asleep or trouble staying asleep is the main symptom. This may lead to other symptoms, such as:  Feeling fatigued.  Feeling nervous about going to sleep.  Not feeling rested in the morning.  Having trouble concentrating.  Feeling irritable, anxious, or depressed.  How is this treated? Treatment for insomnia depends on the cause. If your insomnia is caused by an underlying condition, treatment will focus on addressing the condition. Treatment may also include:  Medicines to help you  sleep.  Counseling or therapy.  Lifestyle adjustments.  Follow these instructions at home:  Take medicines only as directed by your health care provider.  Keep regular sleeping and waking hours. Avoid naps.  Keep a sleep diary to help you and your health care provider figure out what could be causing your insomnia. Include: ? When you sleep. ? When you wake up during the night. ? How well you sleep. ? How rested you feel the next day. ? Any side effects of medicines you are taking. ? What you eat and drink.  Make your bedroom a comfortable place where it is easy to fall asleep: ? Put up shades or special blackout curtains to block light from outside. ? Use a white noise machine to block noise. ?  Keep the temperature cool.  Exercise regularly as directed by your health care provider. Avoid exercising right before bedtime.  Use relaxation techniques to manage stress. Ask your health care provider to suggest some techniques that may work well for you. These may include: ? Breathing exercises. ? Routines to release muscle tension. ? Visualizing peaceful scenes.  Cut back on alcohol, caffeinated beverages, and cigarettes, especially close to bedtime. These can disrupt your sleep.  Do not overeat or eat spicy foods right before bedtime. This can lead to digestive discomfort that can make it hard for you to sleep.  Limit screen use before bedtime. This includes: ? Watching TV. ? Using your smartphone, tablet, and computer.  Stick to a routine. This can help you fall asleep faster. Try to do a quiet activity, brush your teeth, and go to bed at the same time each night.  Get out of bed if you are still awake after 15 minutes of trying to sleep. Keep the lights down, but try reading or doing a quiet activity. When you feel sleepy, go back to bed.  Make sure that you drive carefully. Avoid driving if you feel very sleepy.  Keep all follow-up appointments as directed by your health  care provider. This is important. Contact a health care provider if:  You are tired throughout the day or have trouble in your daily routine due to sleepiness.  You continue to have sleep problems or your sleep problems get worse. Get help right away if:  You have serious thoughts about hurting yourself or someone else. This information is not intended to replace advice given to you by your health care provider. Make sure you discuss any questions you have with your health care provider. Depression Medications:  Taking the medicine as directed and not missing any doses is one of the best things you can do to treat your depression.  Here are some things to keep in mind:  1) Side effects (stomach upset, some increased anxiety) may happen before you notice a benefit.  These side effects typically go away over time. 2) Changes to your dose of medicine or a change in medication all together is sometimes necessary 3) Most people need to be on medication at least 6-12 months 4) Many people will notice an improvement within two weeks but the full effect of the medication can take up to 4-6 weeks 5) Stopping the medication when you start feeling better often results in a return of symptoms 6) If you start having thoughts of hurting yourself or others after starting this medicine, please call the office immediately at [phone].      Document Released: 06/07/2000 Document Revised: 11/10/2015 Document Reviewed: 03/11/2014 Elsevier Interactive Patient Education  [employer]2018 Elsevier Inc.

## 2017-08-06 ENCOUNTER — Encounter: Payer: Self-pay | Admitting: Emergency Medicine

## 2017-09-10 ENCOUNTER — Other Ambulatory Visit: Payer: Self-pay | Admitting: Primary Care

## 2017-09-10 DIAGNOSIS — F339 Major depressive disorder, recurrent, unspecified: Secondary | ICD-10-CM

## 2017-09-10 MED ORDER — DULOXETINE HCL 20 MG PO CPEP: 20.0000 mg | ORAL_CAPSULE | Freq: Every day | ORAL | 0 refills | Status: DC

## 2017-09-10 NOTE — Telephone Encounter (Signed)
Copied from CRM [phone]. Topic: General - Other >> Sep 10, 2017  9:05 AM [name], [name], RMA wrote: Reason for CRM: Medication refill request for DULoxetine (CYMBALTA) 20 MG [facility]

## 2017-09-10 NOTE — Telephone Encounter (Signed)
LOV 05/13/17 with Dr. Evelene CroonAndy  Katherine Clark,NP  [facility] [place]

## 2017-09-10 NOTE — Telephone Encounter (Signed)
Needs office visit for any further refills, will allow 30 day supply until she can get in. Please schedule.

## 2017-09-10 NOTE — Telephone Encounter (Signed)
Message left for patient to return my call.  

## 2017-09-10 NOTE — Telephone Encounter (Signed)
Last seen with Jae DireKate on 09/05/2016.

## 2017-09-15 NOTE — Telephone Encounter (Signed)
Office visit on 10/16/2017

## 2017-10-14 ENCOUNTER — Other Ambulatory Visit: Payer: Self-pay | Admitting: Primary Care

## 2017-10-14 DIAGNOSIS — F339 Major depressive disorder, recurrent, unspecified: Secondary | ICD-10-CM

## 2017-10-16 ENCOUNTER — Other Ambulatory Visit (HOSPITAL_COMMUNITY)
Admission: RE | Admit: 2017-10-16 | Discharge: 2017-10-16 | Disposition: A | Discharge status: Home / Self Care | Payer: Commercial Managed Care - PPO | Source: Ambulatory Visit | Attending: Primary Care | Admitting: Primary Care

## 2017-10-16 ENCOUNTER — Ambulatory Visit (INDEPENDENT_AMBULATORY_CARE_PROVIDER_SITE_OTHER): Payer: Commercial Managed Care - PPO | Admitting: Primary Care

## 2017-10-16 ENCOUNTER — Encounter: Payer: Self-pay | Admitting: Primary Care

## 2017-10-16 VITALS — BP 104/66 | HR 81 | Temp 98.3°F | Ht 63.0 in | Wt 106.5 lb

## 2017-10-16 DIAGNOSIS — Z124 Encounter for screening for malignant neoplasm of cervix: Secondary | ICD-10-CM | POA: Diagnosis not present

## 2017-10-16 DIAGNOSIS — F339 Major depressive disorder, recurrent, unspecified: Secondary | ICD-10-CM

## 2017-10-16 DIAGNOSIS — Z0000 Encounter for general adult medical examination without abnormal findings: Secondary | ICD-10-CM | POA: Diagnosis not present

## 2017-10-16 DIAGNOSIS — K228 Other specified diseases of esophagus: Secondary | ICD-10-CM | POA: Diagnosis not present

## 2017-10-16 DIAGNOSIS — K2289 Other specified disease of esophagus: Secondary | ICD-10-CM

## 2017-10-16 LAB — LIPID PANEL
Cholesterol: 242 mg/dL — ABNORMAL HIGH (ref 0–200)
HDL: 89.3 mg/dL (ref >39.00)
LDL Cholesterol: 144 mg/dL — ABNORMAL HIGH (ref 0–99)
NonHDL: 153.01
Total CHOL/HDL Ratio: 3
Triglycerides: 45 mg/dL (ref 0.0–149.0)
VLDL: 9 mg/dL (ref 0.0–40.0)

## 2017-10-16 LAB — COMPREHENSIVE METABOLIC PANEL
ALT: 9 U/L (ref 0–35)
AST: 12 U/L (ref 0–37)
Albumin: 4.5 g/dL (ref 3.5–5.2)
Alkaline Phosphatase: 36 U/L — ABNORMAL LOW (ref 39–117)
BUN: 16 mg/dL (ref 6–23)
CO2: 30 mEq/L (ref 19–32)
Calcium: 9.4 mg/dL (ref 8.4–10.5)
Chloride: 104 mEq/L (ref 96–112)
Creatinine, Ser: 0.6 mg/dL (ref 0.40–1.20)
GFR: 120.11 mL/min (ref >60.00)
Glucose, Bld: 95 mg/dL (ref 70–99)
Potassium: 4.3 mEq/L (ref 3.5–5.1)
Sodium: 138 mEq/L (ref 135–145)
Total Bilirubin: 0.3 mg/dL (ref 0.2–1.2)
Total Protein: 7.6 g/dL (ref 6.0–8.3)

## 2017-10-16 MED ORDER — DULOXETINE HCL 20 MG PO CPEP: ORAL_CAPSULE | ORAL | 3 refills | Status: DC

## 2017-10-16 NOTE — Addendum Note (Signed)
Addended by: Tawnya CrookSAMBATH, Esthefany Herrig on: 10/16/2017 08:48 AM   Modules accepted: Orders

## 2017-10-16 NOTE — Progress Notes (Signed)
Subjective:    Patient ID: Traci Cooper, female    DOB: 11/22/1980, 37 y.o.   MRN: [id number]  HPI  [name] is a 37 year old female who presents today for complete physical.  Immunizations: -Tetanus: Unsure, she will check records.  -Influenza: Did not complete last season    Diet: She endorses a healthy diet. Breakfast: Yogurt, cookies, muffin Lunch: Cafeteria food (fruit, vegetable, meat, pasta) Dinner: Take out, fast food, sometimes cooks (beans, sausage, salad, chicken) Snacks: Cheese, pretzels, yogurt Desserts: Occasionally  Beverages: Coffee, water, juice  Exercise: She is not exercising Eye exam: No recent exam Dental exam: Completes semi-annually  Pap Smear: Completed 3-4 years ago.    Review of Systems  Constitutional: Negative for unexpected weight change.  HENT: Negative for rhinorrhea.   Respiratory: Negative for cough and shortness of breath.   Cardiovascular: Negative for chest pain.  Gastrointestinal: Negative for constipation and diarrhea.  Genitourinary: Negative for difficulty urinating and menstrual problem.  Musculoskeletal: Negative for arthralgias and myalgias.  Skin: Negative for rash.  Allergic/Immunologic: Negative for environmental allergies.  Neurological: Negative for dizziness, numbness and headaches.  Psychiatric/Behavioral:       Doing well on Cymbalta. Not using Ambien       Past Medical History:  Diagnosis Date  . Chicken pox   . GERD (gastroesophageal reflux disease)   . Kidney stones   . Urinary tract infection      Social History   Socioeconomic History  . Marital status: Single    Spouse name: Not on file  . Number of children: Not on file  . Years of education: Not on file  . Highest education level: Not on file  Occupational History  . Not on file  Social Needs  . Financial resource strain: Not on file  . Food insecurity:    Worry: Not on file    Inability: Not on file  . Transportation needs:    Medical:  Not on file    Non-medical: Not on file  Tobacco Use  . Smoking status: Never Smoker  . Smokeless tobacco: Never Used  Substance and Sexual Activity  . Alcohol use: No  . Drug use: No  . Sexual activity: Not on file  Lifestyle  . Physical activity:    Days per week: Not on file    Minutes per session: Not on file  . Stress: Not on file  Relationships  . Social connections:    Talks on phone: Not on file    Gets together: Not on file    Attends religious service: Not on file    Active member of club or organization: Not on file    Attends meetings of clubs or organizations: Not on file    Relationship status: Not on file  . Intimate partner violence:    Fear of current or ex partner: Not on file    Emotionally abused: Not on file    Physically abused: Not on file    Forced sexual activity: Not on file  Other Topics Concern  . Not on file  Social History Narrative   Single.   No children.    Works in [employer] as a Education officer, museum.    Enjoys walking, Diplomatic Services operational officer.     Past Surgical History:  Procedure Laterality Date  . APPENDECTOMY  1996 or 1997    Family History  Problem Relation Age of Onset  . Stroke Maternal Grandfather   . Hypertension Maternal Grandfather  Allergies  Allergen Reactions  . Erythromycin   . Tetracyclines & Related Nausea And Vomiting    No current outpatient medications on file prior to visit.   No current facility-administered medications on file prior to visit.     BP 104/66   Pulse 81   Temp 98.3 F (36.8 C) (Oral)   Ht 5\' 3"  (1.6 m)   Wt 106 lb 8 oz (48.3 kg)   LMP 09/29/2017   SpO2 98%   BMI 18.87 kg/m    Objective:   Physical Exam  Constitutional: She is oriented to person, place, and time. She appears well-nourished.  HENT:  Right Ear: Tympanic membrane and ear canal normal.  Left Ear: Tympanic membrane and ear canal normal.  Nose: Nose normal.  Mouth/Throat: Oropharynx is clear and moist.  Eyes: Pupils are equal,  round, and reactive to light. Conjunctivae and EOM are normal.  Neck: Neck supple. No thyromegaly present.  Cardiovascular: Normal rate and regular rhythm.  No murmur heard. Pulmonary/Chest: Effort normal and breath sounds normal. She has no rales.  Abdominal: Soft. Bowel sounds are normal. There is no tenderness.  Genitourinary: There is no tenderness or lesion on the right labia. There is no tenderness or lesion on the left labia. Cervix exhibits no motion tenderness and no discharge. Right adnexum displays no tenderness. Left adnexum displays no tenderness. No erythema in the vagina. No vaginal discharge found.  Musculoskeletal: Normal range of motion.  Lymphadenopathy:    She has no cervical adenopathy.  Neurological: She is alert and oriented to person, place, and time. She has normal reflexes. No cranial nerve deficit.  Skin: Skin is warm and dry. No rash noted.  Psychiatric: She has a normal mood and affect.          Assessment & Plan:

## 2017-10-16 NOTE — Assessment & Plan Note (Signed)
Unsure of last tetanus vaccination, she would like to check her records first. Pap smear due, completed today.  Recommended to increase vegetables, fruit, whole grains, regular exercise.  Exam unremarkable. Labs pending. Follow up in 1 year.

## 2017-10-16 NOTE — Assessment & Plan Note (Addendum)
Managed on Cymbalta for which she started in November 2018. Also managed on Ambien 10 for which was given to her during that appointment in November 2018.   Doing better on Cymbalta. Not using Ambien, discouraged use.

## 2017-10-16 NOTE — Patient Instructions (Signed)
Stop by the lab prior to leaving today. I will notify you of your results once received.   We will notify you of your pap smear results once received.  Increase vegetables, fruit, whole grains, lean protein.  Ensure you are consuming 64 ounces of water daily.  Continue Cymbalta as prescribed.  Follow up in 1 year for your annual exam or sooner if needed.  It was a pleasure to see you today!   Preventive Care 18-39 Years, Female Preventive care refers to lifestyle choices and visits with your health care provider that can promote health and wellness. What does preventive care include?  A yearly physical exam. This is also called an annual well check.  Dental exams once or twice a year.  Routine eye exams. Ask your health care provider how often you should have your eyes checked.  Personal lifestyle choices, including: ? Daily care of your teeth and gums. ? Regular physical activity. ? Eating a healthy diet. ? Avoiding tobacco and drug use. ? Limiting alcohol use. ? Practicing safe sex. ? Taking vitamin and mineral supplements as recommended by your health care provider. What happens during an annual well check? The services and screenings done by your health care provider during your annual well check will depend on your age, overall health, lifestyle risk factors, and family history of disease. Counseling Your health care provider may ask you questions about your:  Alcohol use.  Tobacco use.  Drug use.  Emotional well-being.  Home and relationship well-being.  Sexual activity.  Eating habits.  Work and work Statistician.  Method of birth control.  Menstrual cycle.  Pregnancy history.  Screening You may have the following tests or measurements:  Height, weight, and BMI.  Diabetes screening. This is done by checking your blood sugar (glucose) after you have not eaten for a while (fasting).  Blood pressure.  Lipid and cholesterol levels. These may be  checked every 5 years starting at age 62.  Skin check.  Hepatitis C blood test.  Hepatitis B blood test.  Sexually transmitted disease (STD) testing.  BRCA-related cancer screening. This may be done if you have a family history of breast, ovarian, tubal, or peritoneal cancers.  Pelvic exam and Pap test. This may be done every 3 years starting at age 52. Starting at age 42, this may be done every 5 years if you have a Pap test in combination with an HPV test.  Discuss your test results, treatment options, and if necessary, the need for more tests with your health care provider. Vaccines Your health care provider may recommend certain vaccines, such as:  Influenza vaccine. This is recommended every year.  Tetanus, diphtheria, and acellular pertussis (Tdap, Td) vaccine. You may need a Td booster every 10 years.  Varicella vaccine. You may need this if you have not been vaccinated.  HPV vaccine. If you are 85 or younger, you may need three doses over 6 months.  Measles, mumps, and rubella (MMR) vaccine. You may need at least one dose of MMR. You may also need a second dose.  Pneumococcal 13-valent conjugate (PCV13) vaccine. You may need this if you have certain conditions and were not previously vaccinated.  Pneumococcal polysaccharide (PPSV23) vaccine. You may need one or two doses if you smoke cigarettes or if you have certain conditions.  Meningococcal vaccine. One dose is recommended if you are age 66-21 years and a first-year college student living in a residence hall, or if you have one of several medical  conditions. You may also need additional booster doses.  Hepatitis A vaccine. You may need this if you have certain conditions or if you travel or work in places where you may be exposed to hepatitis A.  Hepatitis B vaccine. You may need this if you have certain conditions or if you travel or work in places where you may be exposed to hepatitis B.  Haemophilus influenzae type  b (Hib) vaccine. You may need this if you have certain risk factors.  Talk to your health care provider about which screenings and vaccines you need and how often you need them. This information is not intended to replace advice given to you by your health care provider. Make sure you discuss any questions you have with your health care provider. Document Released: 08/06/2001 Document Revised: 02/28/2016 Document Reviewed: 04/11/2015 Elsevier Interactive Patient Education  [employer].

## 2017-10-16 NOTE — Assessment & Plan Note (Signed)
Overall doing well off meds, watches her diet and avoids laying down within 2 hours after eating.

## 2017-10-17 LAB — CYTOLOGY - PAP
Diagnosis: NEGATIVE
HPV: NOT DETECTED

## 2018-01-23 ENCOUNTER — Encounter: Payer: Self-pay | Admitting: Internal Medicine

## 2018-01-23 ENCOUNTER — Ambulatory Visit: Payer: Commercial Managed Care - PPO | Admitting: Internal Medicine

## 2018-01-23 VITALS — BP 106/78 | HR 81 | Temp 98.7°F | Resp 14 | Ht 63.0 in | Wt 108.2 lb

## 2018-01-23 DIAGNOSIS — L282 Other prurigo: Secondary | ICD-10-CM

## 2018-01-23 DIAGNOSIS — L259 Unspecified contact dermatitis, unspecified cause: Secondary | ICD-10-CM

## 2018-01-23 MED ORDER — METHYLPREDNISOLONE ACETATE 40 MG/ML IJ SUSP
40.0000 mg | Freq: Once | INTRAMUSCULAR | Status: AC
  Administered 2018-01-23: 40 mg via INTRAMUSCULAR

## 2018-01-23 MED ORDER — HYDROCORTISONE 2.5 % EX CREA: TOPICAL_CREAM | Freq: Two times a day (BID) | CUTANEOUS | 0 refills | Status: DC

## 2018-01-23 NOTE — Patient Instructions (Addendum)
Stop new body wash  Try Hydrocortisone 2.5 mg over your body areas broken out Try benadryl, claritin, zyrtec or allegra at night    Hives Hives (urticaria) are itchy, red, swollen areas on your skin. Hives can appear on any part of your body and can vary in size. They can be as small as the tip of a pen or much larger. Hives often fade within 24 hours (acute hives). In other cases, new hives appear after old ones fade. This cycle can continue for several days or weeks (chronic hives). Hives result from your body's reaction to an irritant or to something that you are allergic to (trigger). When you are exposed to a trigger, your body releases a chemical (histamine) that causes redness, itching, and swelling. You can get hives immediately after being exposed to a trigger or hours later. Hives do not spread from person to person (are not contagious). Your hives may get worse with scratching, exercise, and emotional stress. What are the causes? Causes of this condition include:  Allergies to certain foods or ingredients.  Insect bites or stings.  Exposure to pollen or pet dander.  Contact with latex or chemicals.  Spending time in sunlight, heat, or cold (exposure).  Exercise.  Stress.  You can also get hives from some medical conditions and treatments. These include:  Viruses, including the common cold.  Bacterial infections, such as urinary tract infections and strep throat.  Disorders such as vasculitis, lupus, or thyroid disease.  Certain medications.  Allergy shots.  Blood transfusions.  Sometimes, the cause of hives is not known (idiopathic hives). What increases the risk? This condition is more likely to develop in:  Women.  People who have food allergies, especially to citrus fruits, milk, eggs, peanuts, tree nuts, or shellfish.  People who are allergic to: ? Medicines. ? Latex. ? Insects. ? Animals. ? Pollen.  People who have certain medical conditions,  includinglupus or thyroid disease.  What are the signs or symptoms? The main symptom of this condition is raised, itchyred or white bumps or patches on your skin. These areas may:  Become large and swollen (welts).  Change in shape and location, quickly and repeatedly.  Be separate hives or connect over a large area of skin.  Sting or become painful.  Turn white when pressed in the center (blanch).  In severe cases, yourhands, feet, and face may also become swollen. This may occur if hives develop deeper in your skin. How is this diagnosed? This condition is diagnosed based on your symptoms, medical history, and physical exam. Your skin, urine, or blood may be tested to find out what is causing your hives and to rule out other health issues. Your health care provider may also remove a small sample of skin from the affected area and examine it under a microscope (biopsy). How is this treated? Treatment depends on the severity of your condition. Your health care provider may recommend using cool, wet cloths (cool compresses) or taking cool showers to relieve itching. Hives are sometimes treated with medicines, including:  Antihistamines.  Corticosteroids.  Antibiotics.  An injectable medicine (omalizumab). Your health care provider may prescribe this if you have chronic idiopathic hives and you continue to have symptoms even after treatment with antihistamines.  Severe cases may require an emergency injection of adrenaline (epinephrine) to prevent a life-threatening allergic reaction (anaphylaxis). Follow these instructions at home: Medicines  Take or apply over-the-counter and prescription medicines only as told by your health care provider.  If you were prescribed an antibiotic medicine, use it as told by your health care provider. Do not stop taking the antibiotic even if you start to feel better. Skin Care  Apply cool compresses to the affected areas.  Do not scratch or  rub your skin. General instructions  Do not take hot showers or baths. This can make itching worse.  Do not wear tight-fitting clothing.  Use sunscreen and wear protective clothing when you are outside.  Avoid any substances that cause your hives. Keep a journal to help you track what causes your hives. Write down: ? What medicines you take. ? What you eat and drink. ? What products you use on your skin.  Keep all follow-up visits as told by your health care provider. This is important. Contact a health care provider if:  Your symptoms are not controlled with medicine.  Your joints are painful or swollen. Get help right away if:  You have a fever.  You have pain in your abdomen.  Your tongue or lips are swollen.  Your eyelids are swollen.  Your chest or throat feels tight.  You have trouble breathing or swallowing. These symptoms may represent a serious problem that is an emergency. Do not wait to see if the symptoms will go away. Get medical help right away. Call your local emergency services (911 in the [place] Do not drive yourself to the hospital. This information is not intended to replace advice given to you by your health care provider. Make sure you discuss any questions you have with your health care provider. Document Released: 06/10/2005 Document Revised: 11/08/2015 Document Reviewed: 03/29/2015 Elsevier Interactive Patient Education  2018 [employer]  Contact Dermatitis Dermatitis is redness, soreness, and swelling (inflammation) of the skin. Contact dermatitis is a reaction to certain substances that touch the skin. There are two types of contact dermatitis:  Irritant contact dermatitis. This type is caused by something that irritates your skin, such as dry hands from washing them too much. This type does not require previous exposure to the substance for a reaction to occur. This type is more common.  Allergic contact dermatitis. This type is caused by a  substance that you are allergic to, such as a nickel allergy or poison ivy. This type only occurs if you have been exposed to the substance (allergen) before. Upon a repeat exposure, your body reacts to the substance. This type is less common.  What are the causes? Many different substances can cause contact dermatitis. Irritant contact dermatitis is most commonly caused by exposure to:  Makeup.  Soaps.  Detergents.  Bleaches.  Acids.  Metal salts, such as nickel.  Allergic contact dermatitis is most commonly caused by exposure to:  Poisonous plants.  Chemicals.  Jewelry.  Latex.  Medicines.  Preservatives in products, such as clothing.  What increases the risk? This condition is more likely to develop in:  People who have jobs that expose them to irritants or allergens.  People who have certain medical conditions, such as asthma or eczema.  What are the signs or symptoms? Symptoms of this condition may occur anywhere on your body where the irritant has touched you or is touched by you. Symptoms include:  Dryness or flaking.  Redness.  Cracks.  Itching.  Pain or a burning feeling.  Blisters.  Drainage of small amounts of blood or clear fluid from skin cracks.  With allergic contact dermatitis, there may also be swelling in areas such as the eyelids, mouth, or  genitals. How is this diagnosed? This condition is diagnosed with a medical history and physical exam. A patch skin test may be performed to help determine the cause. If the condition is related to your job, you may need to see an occupational medicine specialist. How is this treated? Treatment for this condition includes figuring out what caused the reaction and protecting your skin from further contact. Treatment may also include:  Steroid creams or ointments. Oral steroid medicines may be needed in more severe cases.  Antibiotics or antibacterial ointments, if a skin infection is  present.  Antihistamine lotion or an antihistamine taken by mouth to ease itching.  A bandage (dressing).  Follow these instructions at home: Skin Care  Moisturize your skin as needed.  Apply cool compresses to the affected areas.  Try taking a bath with: ? Epsom salts. Follow the instructions on the packaging. You can get these at your local pharmacy or grocery store. ? Baking soda. Pour a small amount into the bath as directed by your health care provider. ? Colloidal oatmeal. Follow the instructions on the packaging. You can get this at your local pharmacy or grocery store.  Try applying baking soda paste to your skin. Stir water into baking soda until it reaches a paste-like consistency.  Do not scratch your skin.  Bathe less frequently, such as every other day.  Bathe in lukewarm water. Avoid using hot water. Medicines  Take or apply over-the-counter and prescription medicines only as told by your health care provider.  If you were prescribed an antibiotic medicine, take or apply your antibiotic as told by your health care provider. Do not stop using the antibiotic even if your condition starts to improve. General instructions  Keep all follow-up visits as told by your health care provider. This is important.  Avoid the substance that caused your reaction. If you do not know what caused it, keep a journal to try to track what caused it. Write down: ? What you eat. ? What cosmetic products you use. ? What you drink. ? What you wear in the affected area. This includes jewelry.  If you were given a dressing, take care of it as told by your health care provider. This includes when to change and remove it. Contact a health care provider if:  Your condition does not improve with treatment.  Your condition gets worse.  You have signs of infection such as swelling, tenderness, redness, soreness, or warmth in the affected area.  You have a fever.  You have new  symptoms. Get help right away if:  You have a severe headache, neck pain, or neck stiffness.  You vomit.  You feel very sleepy.  You notice red streaks coming from the affected area.  Your bone or joint underneath the affected area becomes painful after the skin has healed.  The affected area turns darker.  You have difficulty breathing. This information is not intended to replace advice given to you by your health care provider. Make sure you discuss any questions you have with your health care provider. Document Released: 06/07/2000 Document Revised: 11/16/2015 Document Reviewed: 10/26/2014 Elsevier Interactive Patient Education  2018 [employer] Inc[employer]    Hives Hives (urticaria) are itchy, red, swollen areas on your skin. Hives can appear on any part of your body and can vary in size. They can be as small as the tip of a pen or much larger. Hives often fade within 24 hours (acute hives). In other cases, new hives appear  after old ones fade. This cycle can continue for several days or weeks (chronic hives). Hives result from your body's reaction to an irritant or to something that you are allergic to (trigger). When you are exposed to a trigger, your body releases a chemical (histamine) that causes redness, itching, and swelling. You can get hives immediately after being exposed to a trigger or hours later. Hives do not spread from person to person (are not contagious). Your hives may get worse with scratching, exercise, and emotional stress. What are the causes? Causes of this condition include:  Allergies to certain foods or ingredients.  Insect bites or stings.  Exposure to pollen or pet dander.  Contact with latex or chemicals.  Spending time in sunlight, heat, or cold (exposure).  Exercise.  Stress.  You can also get hives from some medical conditions and treatments. These include:  Viruses, including the common cold.  Bacterial infections, such as urinary tract  infections and strep throat.  Disorders such as vasculitis, lupus, or thyroid disease.  Certain medications.  Allergy shots.  Blood transfusions.  Sometimes, the cause of hives is not known (idiopathic hives). What increases the risk? This condition is more likely to develop in:  Women.  People who have food allergies, especially to citrus fruits, milk, eggs, peanuts, tree nuts, or shellfish.  People who are allergic to: ? Medicines. ? Latex. ? Insects. ? Animals. ? Pollen.  People who have certain medical conditions, includinglupus or thyroid disease.  What are the signs or symptoms? The main symptom of this condition is raised, itchyred or white bumps or patches on your skin. These areas may:  Become large and swollen (welts).  Change in shape and location, quickly and repeatedly.  Be separate hives or connect over a large area of skin.  Sting or become painful.  Turn white when pressed in the center (blanch).  In severe cases, yourhands, feet, and face may also become swollen. This may occur if hives develop deeper in your skin. How is this diagnosed? This condition is diagnosed based on your symptoms, medical history, and physical exam. Your skin, urine, or blood may be tested to find out what is causing your hives and to rule out other health issues. Your health care provider may also remove a small sample of skin from the affected area and examine it under a microscope (biopsy). How is this treated? Treatment depends on the severity of your condition. Your health care provider may recommend using cool, wet cloths (cool compresses) or taking cool showers to relieve itching. Hives are sometimes treated with medicines, including:  Antihistamines.  Corticosteroids.  Antibiotics.  An injectable medicine (omalizumab). Your health care provider may prescribe this if you have chronic idiopathic hives and you continue to have symptoms even after treatment with  antihistamines.  Severe cases may require an emergency injection of adrenaline (epinephrine) to prevent a life-threatening allergic reaction (anaphylaxis). Follow these instructions at home: Medicines  Take or apply over-the-counter and prescription medicines only as told by your health care provider.  If you were prescribed an antibiotic medicine, use it as told by your health care provider. Do not stop taking the antibiotic even if you start to feel better. Skin Care  Apply cool compresses to the affected areas.  Do not scratch or rub your skin. General instructions  Do not take hot showers or baths. This can make itching worse.  Do not wear tight-fitting clothing.  Use sunscreen and wear protective clothing when you are  outside.  Avoid any substances that cause your hives. Keep a journal to help you track what causes your hives. Write down: ? What medicines you take. ? What you eat and drink. ? What products you use on your skin.  Keep all follow-up visits as told by your health care provider. This is important. Contact a health care provider if:  Your symptoms are not controlled with medicine.  Your joints are painful or swollen. Get help right away if:  You have a fever.  You have pain in your abdomen.  Your tongue or lips are swollen.  Your eyelids are swollen.  Your chest or throat feels tight.  You have trouble breathing or swallowing. These symptoms may represent a serious problem that is an emergency. Do not wait to see if the symptoms will go away. Get medical help right away. Call your local emergency services (911 in the [place] Do not drive yourself to the hospital. This information is not intended to replace advice given to you by your health care provider. Make sure you discuss any questions you have with your health care provider. Document Released: 06/10/2005 Document Revised: 11/08/2015 Document Reviewed: 03/29/2015 Elsevier Interactive Patient  Education  2018 [employer]

## 2018-01-23 NOTE — Addendum Note (Signed)
Addended by: Sandy SalaamWOODWARD, Mahasin Riviere on: 01/23/2018 03:23 PM   Modules accepted: Orders

## 2018-01-23 NOTE — Progress Notes (Signed)
Chief Complaint  Patient presents with  . Acute Visit    rash on arms   Tuesday/Weds started using Lush cosmetics body wash and yesterday am had rash on face, arms, truck itchy esp with heat and clothes feeling well otherwise no fever cough. Feels like something crawling on skin.    Review of Systems  Constitutional: Negative for fever.  Respiratory: Negative for cough.   Cardiovascular: Negative for chest pain.  Skin: Positive for rash.   Past Medical History:  Diagnosis Date  . Chicken pox   . GERD (gastroesophageal reflux disease)   . Kidney stones   . Urinary tract infection    Past Surgical History:  Procedure Laterality Date  . APPENDECTOMY  1996 or 1997   Family History  Problem Relation Age of Onset  . Stroke Maternal Grandfather   . Hypertension Maternal Grandfather    Social History   Socioeconomic History  . Marital status: Single    Spouse name: Not on file  . Number of children: Not on file  . Years of education: Not on file  . Highest education level: Not on file  Occupational History  . Not on file  Social Needs  . Financial resource strain: Not on file  . Food insecurity:    Worry: Not on file    Inability: Not on file  . Transportation needs:    Medical: Not on file    Non-medical: Not on file  Tobacco Use  . Smoking status: Never Smoker  . Smokeless tobacco: Never Used  Substance and Sexual Activity  . Alcohol use: No  . Drug use: No  . Sexual activity: Not on file  Lifestyle  . Physical activity:    Days per week: Not on file    Minutes per session: Not on file  . Stress: Not on file  Relationships  . Social connections:    Talks on phone: Not on file    Gets together: Not on file    Attends religious service: Not on file    Active member of club or organization: Not on file    Attends meetings of clubs or organizations: Not on file    Relationship status: Not on file  . Intimate partner violence:    Fear of current or ex  partner: Not on file    Emotionally abused: Not on file    Physically abused: Not on file    Forced sexual activity: Not on file  Other Topics Concern  . Not on file  Social History Narrative   Single.   No children.    Works in [employer] as a Education officer, museum.    Enjoys walking, Diplomatic Services operational officer.    [place]    Current Meds  Medication Sig  . DULoxetine (CYMBALTA) 20 MG capsule TAKE 1 CAPSULE BY MOUTH EVERY DAY   Allergies  Allergen Reactions  . Erythromycin   . Tetracyclines & Related Nausea And Vomiting   No results found for this or any previous visit (from the past 2160 hour(s)). Objective  Body mass index is 19.17 kg/m. Wt Readings from Last 3 Encounters:  01/23/18 108 lb 3.2 oz (49.1 kg)  10/16/17 106 lb 8 oz (48.3 kg)  05/13/17 101 lb (45.8 kg)   Temp Readings from Last 3 Encounters:  01/23/18 98.7 F (37.1 C) (Oral)  10/16/17 98.3 F (36.8 C) (Oral)  05/13/17 98.9 F (37.2 C) (Oral)   BP Readings from Last 3 Encounters:  01/23/18 106/78  10/16/17 104/66  05/13/17 106/64   Pulse Readings from Last 3 Encounters:  01/23/18 81  10/16/17 81  05/13/17 85    Physical Exam  Constitutional: She is oriented to person, place, and time. Vital signs are normal. She appears well-developed and well-nourished. She is cooperative.  HENT:  Head: Normocephalic and atraumatic.  Mouth/Throat: Oropharynx is clear and moist and mucous membranes are normal.  Eyes: Pupils are equal, round, and reactive to light. Conjunctivae are normal.  Cardiovascular: Normal rate, regular rhythm and normal heart sounds.  Pulmonary/Chest: Effort normal and breath sounds normal.  Neurological: She is alert and oriented to person, place, and time. Gait normal.  Skin: Skin is warm, dry and intact. Rash noted.  Fine papules to face, arms, trunk ? Urticaria vs contact dermatitis    Psychiatric: She has a normal mood and affect. Her speech is normal and behavior is normal. Judgment and thought  content normal. Cognition and memory are normal.  Nursing note and vitals reviewed.   Assessment   1. Papular urticaria vs contact 2/2 new body wash  Plan  1. Depo 40 x 1  otc antihistamine rec and hc 2.5 bid prn  F/u with PCP next week if not better  Provider: Dr. French Anaracy [facility]

## 2018-01-25 ENCOUNTER — Other Ambulatory Visit: Payer: Self-pay | Admitting: Primary Care

## 2018-01-25 DIAGNOSIS — F339 Major depressive disorder, recurrent, unspecified: Secondary | ICD-10-CM

## 2018-03-04 ENCOUNTER — Other Ambulatory Visit: Payer: Self-pay | Admitting: Primary Care

## 2018-03-04 DIAGNOSIS — F339 Major depressive disorder, recurrent, unspecified: Secondary | ICD-10-CM

## 2018-03-05 ENCOUNTER — Ambulatory Visit: Payer: Commercial Managed Care - PPO | Admitting: Family Medicine

## 2018-03-05 ENCOUNTER — Encounter: Payer: Self-pay | Admitting: Family Medicine

## 2018-03-05 VITALS — BP 118/64 | HR 89 | Temp 98.3°F | Ht 63.0 in | Wt 106.2 lb

## 2018-03-05 DIAGNOSIS — F339 Major depressive disorder, recurrent, unspecified: Secondary | ICD-10-CM | POA: Diagnosis not present

## 2018-03-05 DIAGNOSIS — A084 Viral intestinal infection, unspecified: Secondary | ICD-10-CM | POA: Diagnosis not present

## 2018-03-05 MED ORDER — PROMETHAZINE HCL 25 MG PO TABS: 25.0000 mg | ORAL_TABLET | Freq: Three times a day (TID) | ORAL | 0 refills | Status: DC | PRN

## 2018-03-05 NOTE — Progress Notes (Signed)
Subjective:    Patient ID: Traci Cooper, female    DOB: April 18, 1981, 37 y.o.   MRN: [id number]  HPI 37 yo pt of NP Clark here for diarrhea (with fatigue and dizziness)   Also stress issues   Wt Readings from Last 3 Encounters:  03/05/18 106 lb 4 oz (48.2 kg)  01/23/18 108 lb 3.2 oz (49.1 kg)  10/16/17 106 lb 8 oz (48.3 kg)   18.82 kg/m   Symptoms started Thursday evening  Had chills /felt like a fever - 37.6 C - low grade  Chills were on and off (aspirin/ cold and flu med otc)  By Sunday started GI symptoms  Got nauseated (never vomited)   Then diarrhea - very watery , no blood - bm at least every 2 hours (worse if she ate something)  Was able to drink fluids  Drinking water with apple cider vinegar- this helped (1 1/2 d ago) No more fever  No diarrhea for 24 hours  Stomach is queasy  After she eats feels urge to have bm - but no bm  She did take some pills (? Immodium) otc and a syrup (? What it was)   Very tired and weak  Mouth - was dry/that is improved  A little dizzy as well  BP Readings from Last 3 Encounters:  03/05/18 118/64  01/23/18 106/78  10/16/17 104/66   Pulse Readings from Last 3 Encounters:  03/05/18 89  01/23/18 81  10/16/17 81     Has also been under tremendous stress  She tends not to talk about it  No end point to it  Has been trying to leave her boyfriend on and off over a year (wants to move out)  He is in denial  They have been together for 8 years  Fertility issues -she wanted a child (nothing wrong with her and he would not go to get checked out)   Wants to get out of her situation  Does not have family here / does have a good friend   Works in [employer]- has a good job   Mother moved to [place] - she would like to go visit  She would like to go for 2 weeks   Taking cymbalta for her depression - and it does help  Was suicidal in the past- not now    Patient Active Problem List   Diagnosis Date Noted  . Viral gastroenteritis  03/05/2018  . Episode of recurrent major depressive disorder (HCC) 10/16/2017  . Preventative health care 05/03/2016  . Esophageal pain 04/08/2016   Past Medical History:  Diagnosis Date  . Chicken pox   . GERD (gastroesophageal reflux disease)   . Kidney stones   . Urinary tract infection    Past Surgical History:  Procedure Laterality Date  . APPENDECTOMY  1996 or 1997   Social History   Tobacco Use  . Smoking status: Never Smoker  . Smokeless tobacco: Never Used  Substance Use Topics  . Alcohol use: No  . Drug use: No   Family History  Problem Relation Age of Onset  . Stroke Maternal Grandfather   . Hypertension Maternal Grandfather    Allergies  Allergen Reactions  . Erythromycin   . Tetracyclines & Related Nausea And Vomiting   Current Outpatient Medications on File Prior to Visit  Medication Sig Dispense Refill  . DULoxetine (CYMBALTA) 20 MG capsule TAKE 1 CAPSULE BY MOUTH EVERY DAY 90 capsule 1   No current facility-administered medications on  file prior to visit.      Review of Systems  Constitutional: Positive for chills, fatigue and fever. Negative for activity change, appetite change and unexpected weight change.  HENT: Negative for congestion, ear pain, rhinorrhea, sinus pressure and sore throat.   Eyes: Negative for pain, redness and visual disturbance.  Respiratory: Negative for cough, shortness of breath and wheezing.   Cardiovascular: Negative for chest pain and palpitations.  Gastrointestinal: Positive for diarrhea and nausea. Negative for abdominal distention, abdominal pain, anal bleeding, blood in stool, constipation, rectal pain and vomiting.       GERD-baseline   Endocrine: Negative for polydipsia and polyuria.  Genitourinary: Negative for decreased urine volume, dysuria, frequency and urgency.  Musculoskeletal: Negative for arthralgias, back pain and myalgias.  Skin: Negative for pallor and rash.  Allergic/Immunologic: Negative for  environmental allergies.  Neurological: Positive for light-headedness. Negative for dizziness, syncope and headaches.  Hematological: Negative for adenopathy. Does not bruise/bleed easily.  Psychiatric/Behavioral: Positive for decreased concentration and dysphoric mood. Negative for self-injury, sleep disturbance and suicidal ideas. The patient is nervous/anxious.        Objective:   Physical Exam  Constitutional: She appears well-developed and well-nourished. No distress.  Fatigued appearing female /slim to underweight   HENT:  Head: Normocephalic and atraumatic.  Mouth/Throat: Oropharynx is clear and moist.  MMM  Eyes: Pupils are equal, round, and reactive to light. Conjunctivae and EOM are normal. Right eye exhibits no discharge. Left eye exhibits no discharge. No scleral icterus.  Neck: Normal range of motion. Neck supple.  Cardiovascular: Normal rate, regular rhythm and normal heart sounds.  Pulmonary/Chest: Effort normal and breath sounds normal. No respiratory distress. She has no wheezes. She has no rales.  Abdominal: Soft. Bowel sounds are normal. She exhibits no distension and no mass. There is no tenderness. There is no rebound and no guarding. No hernia.  No abd tenderness   bs heard in 4 Q No high pitched sounds   Lymphadenopathy:    She has no cervical adenopathy.  Neurological: She is alert. She displays normal reflexes. No cranial nerve deficit. Coordination normal.  Skin: Skin is warm and dry. No rash noted. No erythema. No pallor.  Psychiatric: Her speech is normal and behavior is normal. Her affect is not blunt and not labile. Thought content is not paranoid. She exhibits a depressed mood. She expresses no homicidal and no suicidal ideation.  Seems fatigued and depressed           Assessment & Plan:   Problem List Items Addressed This Visit      Digestive   Viral gastroenteritis - Primary    Since last Thursday with fever and diarrhea and nausea  Starting  to improve Able to drink fluids Px phenergan for nausea  Antic guidance re: rest /fluids and how quickly to expect fatigue and weakness to improve Update if not starting to improve in a week or if worsening   Out of work until wed -note written  Disc fluid intake and BRAT diet         Other   Episode of recurrent major depressive disorder (HCC)    Worse lately I face of severe stressors- needs to move out of her apt with boyfriend  Would like to go to [place] to spend a few weeks with her mom and then plan getting a new apartment /moving Very 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

## 2018-03-05 NOTE — Patient Instructions (Addendum)
Keep drinking fluids Rest as much as you can  When ready to eat - stay very bland ( bananas/ apple sauce/rice/toast) ---Charissa BashBRAT diet   Acetaminophen for fever or chills is ok over the counter (tylenol)  Update if not starting to improve in a week or if worsening    I put in a referral for counseling

## 2018-03-05 NOTE — Assessment & Plan Note (Signed)
Worse lately I face of severe stressors- needs to move out of her apt with boyfriend  Would like to go to [place] to spend a few weeks with her mom and then plan getting a new apartment /moving Very concerned she will loose her job  Community education officerContinues cymbalta which is very helpful  Open to counseling - (there are time constraints) - ref done  Will plan f/u with her PCP asap  No SI -inst to let us know if this changes Reviewed stressors/ coping techniques/symptoms/ support sources/ tx options and side effects in detail today

## 2018-03-05 NOTE — Assessment & Plan Note (Signed)
Since last Thursday with fever and diarrhea and nausea  Starting to improve Able to drink fluids Px phenergan for nausea  Antic guidance re: rest /fluids and how quickly to expect fatigue and weakness to improve Update if not starting to improve in a week or if worsening   Out of work until wed -note written  Disc fluid intake and BRAT diet

## 2018-03-09 ENCOUNTER — Telehealth: Payer: Self-pay | Admitting: Primary Care

## 2018-03-09 NOTE — Telephone Encounter (Signed)
Given to [name] 

## 2018-03-09 NOTE — Telephone Encounter (Signed)
[employer] faxed short term disability paperwork In dr tower in box

## 2018-03-09 NOTE — Telephone Encounter (Signed)
Done and in IN box 

## 2018-03-10 NOTE — Telephone Encounter (Signed)
Pt aware.

## 2018-03-10 NOTE — Telephone Encounter (Signed)
Paperwork faxed °Copy for pt °Copy for scan °

## 2018-03-11 ENCOUNTER — Ambulatory Visit: Payer: Commercial Managed Care - PPO | Admitting: Primary Care

## 2018-03-11 ENCOUNTER — Encounter: Payer: Self-pay | Admitting: Primary Care

## 2018-03-11 VITALS — BP 124/76 | HR 88 | Temp 98.6°F | Ht 63.0 in | Wt 105.8 lb

## 2018-03-11 DIAGNOSIS — F411 Generalized anxiety disorder: Secondary | ICD-10-CM | POA: Diagnosis not present

## 2018-03-11 DIAGNOSIS — R197 Diarrhea, unspecified: Secondary | ICD-10-CM | POA: Diagnosis not present

## 2018-03-11 DIAGNOSIS — F331 Major depressive disorder, recurrent, moderate: Secondary | ICD-10-CM

## 2018-03-11 DIAGNOSIS — A084 Viral intestinal infection, unspecified: Secondary | ICD-10-CM | POA: Diagnosis not present

## 2018-03-11 MED ORDER — DULOXETINE HCL 30 MG PO CPEP: 30.0000 mg | ORAL_CAPSULE | Freq: Every day | ORAL | 0 refills | Status: DC

## 2018-03-11 NOTE — Progress Notes (Signed)
Subjective:    Patient ID: Traci Cooper, female    DOB: 10-23-1980, 37 y.o.   MRN: [id number]  HPI  Traci Cooper is a 37 year old female who presents today for follow up.  She was last evaluated on 03/05/18 with complaints of diarrhea, nausea, fevers, dizziness. Admitted to being under a lot of stress, especially regarding her relationship with her boyfriend. They have been together for 8 years and are havnig some fertility issues. She is trying to leave her boyfriend. She was diagnosed with viral gastritis and encouraged to follow up with PCP.   She continues to experience chills, sweats, nausea, diarrhea. She endorses a history of diarrhea and these symptoms since she was a teenager. She ran a fever of 99.9 four days ago, no fevers since. She isn't eating much as she will have diarrhea right after, she is drinking juice and water. Overall she's feeling slightly better but not much. She isn't sure if she actually had a viral infection or if her symptoms are due to stress.   She is currently managed on duloxetine for depression which has helped for depression overall. She was once managed on duloxetine 60 mg years ago and did well.   Her stress comes from both work and her personal life. She's trying to leave her boyfriend and is looking for a place to live. She endorses a lot of work stress and is considering quitting. She has no family locally and is with a friend here today. She would like to visit her mother in [place] and would like a leave of absence from work in order to reduce symptoms and rest. She is requesting a leave for 4 weeks which can be done through [employer]. She has an appointment with therapy on October 7th.   PHQ 9 score of 12 and GAD 7 score of 14 today. She denies SI/HI.  Review of Systems  Constitutional: Negative for fever.  Gastrointestinal: Positive for diarrhea and nausea. Negative for vomiting.       Intermittent abdominal cramping  Psychiatric/Behavioral:   See HPI       Past Medical History:  Diagnosis Date  . Chicken pox   . GERD (gastroesophageal reflux disease)   . Kidney stones   . Urinary tract infection      Social History   Socioeconomic History  . Marital status: Single    Spouse name: Not on file  . Number of children: Not on file  . Years of education: Not on file  . Highest education level: Not on file  Occupational History  . Not on file  Social Needs  . Financial resource strain: Not on file  . Food insecurity:    Worry: Not on file    Inability: Not on file  . Transportation needs:    Medical: Not on file    Non-medical: Not on file  Tobacco Use  . Smoking status: Never Smoker  . Smokeless tobacco: Never Used  Substance and Sexual Activity  . Alcohol use: No  . Drug use: No  . Sexual activity: Not on file  Lifestyle  . Physical activity:    Days per week: Not on file    Minutes per session: Not on file  . Stress: Not on file  Relationships  . Social connections:    Talks on phone: Not on file    Gets together: Not on file    Attends religious service: Not on file    Active member of club  or organization: Not on file    Attends meetings of clubs or organizations: Not on file    Relationship status: Not on file  . Intimate partner violence:    Fear of current or ex partner: Not on file    Emotionally abused: Not on file    Physically abused: Not on file    Forced sexual activity: Not on file  Other Topics Concern  . Not on file  Social History Narrative   Single.   No children.    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    Enjoys walking, Diplomatic Services operational officerphotography.    From [place]     Past Surgical History:  Procedure Laterality Date  . APPENDECTOMY  1996 or 1997    Family History  Problem Relation Age of Onset  . Stroke Maternal Grandfather   . Hypertension Maternal Grandfather     Allergies  Allergen Reactions  . Erythromycin   . Tetracyclines & Related Nausea And Vomiting    Current  Outpatient Medications on File Prior to Visit  Medication Sig Dispense Refill  . promethazine (PHENERGAN) 25 MG tablet Take 1 tablet (25 mg total) by mouth every 8 (eight) hours as needed for nausea or vomiting. Caution of sedation (Patient not taking: Reported on 03/11/2018) 20 tablet 0   No current facility-administered medications on file prior to visit.     BP 124/76   Pulse 88   Temp 98.6 F (37 C) (Oral)   Ht 5\' 3"  (1.6 m)   Wt 105 lb 12 oz (48 kg)   LMP 02/10/2018   SpO2 99%   BMI 18.73 kg/m    Objective:   Physical Exam  Constitutional: She appears well-nourished. She does not appear ill.  Neck: Neck supple.  Cardiovascular: Normal rate and regular rhythm.  Respiratory: Effort normal and breath sounds normal.  GI: Soft. Normal appearance. There is tenderness in the right lower quadrant and left lower quadrant.    Skin: Skin is warm and dry.  Psychiatric: She has a normal mood and affect.           Assessment & Plan:

## 2018-03-11 NOTE — Assessment & Plan Note (Signed)
She doesn't seem to think symptoms are much better, she is tender to bilateral lower quadrants. Unsure if this is more IBS due to stress/anxiety or actual infection.   Will start with CBC, CMP, and stool studies. She does appear stable and hydrated, not sickly. She is stable for outpatient treatment.

## 2018-03-11 NOTE — Assessment & Plan Note (Signed)
Deteriorated. PHQ 9 score of 12 and GAD 7 score of 14 today. She denies recent SI/HI.  Increase Cymbalta to 30 mg. Will approve FMLA for leave of absence for anxiety and depression. Start date being 03/12/18. She will request paperwork through HR.  She will see her therapist in October. We will have her back in our office in 3-4 weeks for follow up. 

## 2018-03-11 NOTE — Patient Instructions (Addendum)
We've increased the dose of your duloxetine (Cymbalta) to 30 mg. Take 1 capsule by mouth daily.  Please request FMLA paperwork through your HR department with a start date of 03/12/18. We will go for four weeks.   Follow up with Camila Lisman as scheduled.   Schedule a follow up visit for me when you return.  It was a pleasure to see you today!

## 2018-03-11 NOTE — Assessment & Plan Note (Signed)
Deteriorated. PHQ 9 score of 12 and GAD 7 score of 14 today. She denies recent SI/HI.  Increase Cymbalta to 30 mg. Will approve FMLA for leave of absence for anxiety and depression. Start date being 03/12/18. She will request paperwork through HR.  She will see her therapist in October. We will have her back in our office in 3-4 weeks for follow up.

## 2018-03-11 NOTE — Addendum Note (Signed)
Addended by: Wendi MayaGREESON, Adewale Pucillo on: 03/11/2018 05:30 PM   Modules accepted: Orders

## 2018-03-12 ENCOUNTER — Other Ambulatory Visit: Payer: Commercial Managed Care - PPO

## 2018-03-12 DIAGNOSIS — R197 Diarrhea, unspecified: Secondary | ICD-10-CM

## 2018-03-12 LAB — CBC WITH DIFFERENTIAL/PLATELET
Basophils Absolute: 0.1 10*3/uL (ref 0.0–0.1)
Basophils Relative: 0.8 % (ref 0.0–3.0)
Eosinophils Absolute: 0 10*3/uL (ref 0.0–0.7)
Eosinophils Relative: 0.2 % (ref 0.0–5.0)
HCT: 38.1 % (ref 36.0–46.0)
Hemoglobin: 12.4 g/dL (ref 12.0–15.0)
Lymphocytes Relative: 15.8 % (ref 12.0–46.0)
Lymphs Abs: 1.5 10*3/uL (ref 0.7–4.0)
MCHC: 32.6 g/dL (ref 30.0–36.0)
MCV: 79.4 fl (ref 78.0–100.0)
Monocytes Absolute: 0.6 10*3/uL (ref 0.1–1.0)
Monocytes Relative: 6.2 % (ref 3.0–12.0)
Neutro Abs: 7.2 10*3/uL (ref 1.4–7.7)
Neutrophils Relative %: 77 % (ref 43.0–77.0)
Platelets: 356 10*3/uL (ref 150.0–400.0)
RBC: 4.8 Mil/uL (ref 3.87–5.11)
RDW: 13.8 % (ref 11.5–15.5)
WBC: 9.4 10*3/uL (ref 4.0–10.5)

## 2018-03-12 LAB — COMPREHENSIVE METABOLIC PANEL
ALT: 21 U/L (ref 0–35)
AST: 19 U/L (ref 0–37)
Albumin: 4.5 g/dL (ref 3.5–5.2)
Alkaline Phosphatase: 40 U/L (ref 39–117)
BUN: 12 mg/dL (ref 6–23)
CO2: 27 mEq/L (ref 19–32)
Calcium: 9.7 mg/dL (ref 8.4–10.5)
Chloride: 104 mEq/L (ref 96–112)
Creatinine, Ser: 0.61 mg/dL (ref 0.40–1.20)
GFR: 117.58 mL/min (ref >60.00)
Glucose, Bld: 95 mg/dL (ref 70–99)
Potassium: 4.5 mEq/L (ref 3.5–5.1)
Sodium: 141 mEq/L (ref 135–145)
Total Bilirubin: 0.3 mg/dL (ref 0.2–1.2)
Total Protein: 7.7 g/dL (ref 6.0–8.3)

## 2018-03-17 LAB — GASTROINTESTINAL PATHOGEN PANEL PCR
C. difficile Tox A/B, PCR: NOT DETECTED
Campylobacter, PCR: NOT DETECTED
Cryptosporidium, PCR: NOT DETECTED
E coli (ETEC) LT/ST PCR: NOT DETECTED
E coli (STEC) stx1/stx2, PCR: NOT DETECTED
E coli 0157, PCR: NOT DETECTED
Giardia lamblia, PCR: NOT DETECTED
Norovirus, PCR: NOT DETECTED
Rotavirus A, PCR: NOT DETECTED
Salmonella, PCR: NOT DETECTED
Shigella, PCR: NOT DETECTED

## 2018-03-30 ENCOUNTER — Ambulatory Visit: Payer: Commercial Managed Care - PPO | Admitting: Psychology

## 2018-04-07 ENCOUNTER — Ambulatory Visit: Payer: Commercial Managed Care - PPO | Admitting: Primary Care

## 2018-04-07 ENCOUNTER — Other Ambulatory Visit: Payer: Self-pay | Admitting: Primary Care

## 2018-04-07 ENCOUNTER — Encounter: Payer: Self-pay | Admitting: Primary Care

## 2018-04-07 DIAGNOSIS — F411 Generalized anxiety disorder: Secondary | ICD-10-CM

## 2018-04-07 DIAGNOSIS — F331 Major depressive disorder, recurrent, moderate: Secondary | ICD-10-CM

## 2018-04-07 NOTE — Assessment & Plan Note (Addendum)
Much better on Cymbalta 30 mg, also with leave of absence. Abdominal symptoms have resolved. She will be moving out of her boyfriends apartment this weekend, agree to have her return to work after this is complete which will be 10/24. Re-faxed paperwork.   Continue Cymbalta 30 mg. Follow up with therapist.

## 2018-04-07 NOTE — Progress Notes (Signed)
Subjective:    Patient ID: Traci Cooper, female    DOB: 05-Sep-1980, 37 y.o.   MRN: [id number]  HPI  Traci Cooper is a 37 year old female who presents today for follow up of anxiety.   She was last evaluated in mid September 2019 with reports of increased stress from a relationship with her boyfriend, increased stress at work, GI upset. During that visit her symptoms were suspected to be due to uncontrolled anxiety so her Cymbalta was increased to 30 mg and she was advised to see her therapist. She was also considering about a short term leave from work.   Since her last visit she went to see her family in [place] and had a good time. She's doing much better with anxiety overall. She is compliant to duloxetine 30 mg. Her FMLA is up tomorrow with a return to work date for tomorrow. She would like to return on October 24 th as she will be moving out of her boyfriend's apartment this weekend and early next week. She has an appointment with therapy on October 17th. She denies SI/HI.   Review of Systems  Respiratory: Negative for shortness of breath.   Cardiovascular: Negative for chest pain.  Gastrointestinal: Negative for abdominal pain, nausea and vomiting.  Psychiatric/Behavioral:       See HPI       Past Medical History:  Diagnosis Date  . Chicken pox   . GERD (gastroesophageal reflux disease)   . Kidney stones   . Urinary tract infection      Social History   Socioeconomic History  . Marital status: Single    Spouse name: Not on file  . Number of children: Not on file  . Years of education: Not on file  . Highest education level: Not on file  Occupational History  . Not on file  Social Needs  . Financial resource strain: Not on file  . Food insecurity:    Worry: Not on file    Inability: Not on file  . Transportation needs:    Medical: Not on file    Non-medical: Not on file  Tobacco Use  . Smoking status: Never Smoker  . Smokeless tobacco: Never Used    Substance and Sexual Activity  . Alcohol use: No  . Drug use: No  . Sexual activity: Not on file  Lifestyle  . Physical activity:    Days per week: Not on file    Minutes per session: Not on file  . Stress: Not on file  Relationships  . Social connections:    Talks on phone: Not on file    Gets together: Not on file    Attends religious service: Not on file    Active member of club or organization: Not on file    Attends meetings of clubs or organizations: Not on file    Relationship status: Not on file  . Intimate partner violence:    Fear of current or ex partner: Not on file    Emotionally abused: Not on file    Physically abused: Not on file    Forced sexual activity: Not on file  Other Topics Concern  . Not on file  Social History Narrative   Single.   No children.    Works in [employer] as a Education officer, museum.    Enjoys walking, Diplomatic Services operational officer.    [place]     Past Surgical History:  Procedure Laterality Date  . APPENDECTOMY  1996 or 1997  Family History  Problem Relation Age of Onset  . Stroke Maternal Grandfather   . Hypertension Maternal Grandfather     Allergies  Allergen Reactions  . Erythromycin   . Tetracyclines & Related Nausea And Vomiting    Current Outpatient Medications on File Prior to Visit  Medication Sig Dispense Refill  . DULoxetine (CYMBALTA) 30 MG capsule Take 1 capsule (30 mg total) by mouth daily. 90 capsule 0   No current facility-administered medications on file prior to visit.     BP 110/74   Pulse 79   Temp 98 F (36.7 C) (Oral)   Ht 5\' 3"  (1.6 m)   Wt 106 lb 8 oz (48.3 kg)   LMP 04/05/2018   SpO2 99%   BMI 18.87 kg/m    Objective:   Physical Exam  Constitutional: She appears well-nourished.  Neck: Neck supple.  Cardiovascular: Normal rate and regular rhythm.  Respiratory: Effort normal and breath sounds normal.  Skin: Skin is warm and dry.  Psychiatric: She has a normal mood and affect.           Assessment  & Plan:

## 2018-04-07 NOTE — Telephone Encounter (Signed)
Pt stated the pharmacy gave her 30 day supply of Cymbalta today  and she does have 1 more more refill.

## 2018-04-07 NOTE — Patient Instructions (Signed)
Continue duloxetine 30 mg daily. Please message me if you'd like to reduce the does, just make sure to allow enough time on the 30 mg dose.  We will fax your FMLA paperwork now with a return date of 04/16/18.  Please call or message me if you have any problems at the pharmacy.  It was a pleasure to see you today!

## 2018-04-07 NOTE — Telephone Encounter (Signed)
Noted. I can send additional refills. Does she want [place] to send to a mail order pharmacy so she can get 90 days at a time? If so then which mail order? If not then I'll send refills to [facility].

## 2018-04-09 ENCOUNTER — Ambulatory Visit: Payer: Commercial Managed Care - PPO | Admitting: Psychology

## 2018-04-09 DIAGNOSIS — F3289 Other specified depressive episodes: Secondary | ICD-10-CM

## 2018-04-09 NOTE — Telephone Encounter (Signed)
Message left for patient to return my call.  

## 2018-04-10 ENCOUNTER — Telehealth: Payer: Self-pay | Admitting: Primary Care

## 2018-04-10 MED ORDER — DULOXETINE HCL 30 MG PO CPEP: 30.0000 mg | ORAL_CAPSULE | Freq: Every day | ORAL | 0 refills | Status: DC

## 2018-04-10 NOTE — Telephone Encounter (Signed)
Spoke with pt she will have fmla rep fax paperwork to be filled out

## 2018-04-10 NOTE — Telephone Encounter (Signed)
Patient called back, She stated that she would like to send it to mail order whichis [facility]. The cost supposed to be cheaper for her.

## 2018-04-10 NOTE — Telephone Encounter (Signed)
Copied from CRM [id number]. Topic: Quick Communication - See Telephone Encounter >> Apr 10, 2018 10:11 AM Windy Kalata, NT wrote: CRM for notification. See Telephone encounter for: 04/10/18.  Patient is calling and states she received a call from her FMLA representative and states they can not approve it because they have not received patient information from Mayra Reel. Please advise and contact patient.

## 2018-04-13 ENCOUNTER — Telehealth: Payer: Self-pay | Admitting: Primary Care

## 2018-04-13 NOTE — Telephone Encounter (Signed)
Completed and placed on Robins desk. 

## 2018-04-13 NOTE — Telephone Encounter (Signed)
[employer] faxed paperwork Needing additional information  In kate's in box

## 2018-04-13 NOTE — Telephone Encounter (Signed)
Paperwork faxed °

## 2018-04-14 ENCOUNTER — Ambulatory Visit: Payer: Self-pay | Admitting: Primary Care

## 2018-04-15 NOTE — Telephone Encounter (Signed)
Copy for pt Copy for scan Left message asking pt to call office

## 2018-04-28 ENCOUNTER — Ambulatory Visit (INDEPENDENT_AMBULATORY_CARE_PROVIDER_SITE_OTHER): Payer: Commercial Managed Care - PPO | Admitting: Psychology

## 2018-04-28 DIAGNOSIS — F3289 Other specified depressive episodes: Secondary | ICD-10-CM | POA: Diagnosis not present

## 2018-05-12 ENCOUNTER — Ambulatory Visit: Payer: Self-pay | Admitting: Psychology

## 2018-05-14 ENCOUNTER — Telehealth: Payer: Self-pay | Admitting: Primary Care

## 2018-05-14 NOTE — Telephone Encounter (Signed)
[employer] faxed paperwork needing additional information for pt being out from 04/08/18 to 04/15/18.  You adjusted her time out on 04/07/18 appointment to go back to work on 04/16/18  Paperwork in [place] in box for review and signature

## 2018-05-15 NOTE — Telephone Encounter (Signed)
Completed and placed on Robins desk. 

## 2018-05-20 NOTE — Telephone Encounter (Signed)
Paperwork faxed 11/22 Copy for scan

## 2018-05-29 ENCOUNTER — Other Ambulatory Visit: Payer: Self-pay | Admitting: Internal Medicine

## 2018-05-29 DIAGNOSIS — F331 Major depressive disorder, recurrent, moderate: Secondary | ICD-10-CM

## 2018-05-29 DIAGNOSIS — F411 Generalized anxiety disorder: Secondary | ICD-10-CM

## 2018-05-29 NOTE — Telephone Encounter (Signed)
Noted, refill sent to pharmacy. 

## 2018-05-29 NOTE — Telephone Encounter (Signed)
Last filled 04/10/18 #90 with no refill... Last OV 04/07/18.Marland Kitchen.Marland Kitchen.Please advise

## 2018-06-30 ENCOUNTER — Ambulatory Visit (INDEPENDENT_AMBULATORY_CARE_PROVIDER_SITE_OTHER): Payer: Commercial Managed Care - PPO

## 2018-06-30 ENCOUNTER — Encounter: Payer: Self-pay | Admitting: Family Medicine

## 2018-06-30 ENCOUNTER — Ambulatory Visit: Payer: Commercial Managed Care - PPO | Admitting: Family Medicine

## 2018-06-30 VITALS — BP 98/62 | HR 80 | Temp 98.3°F | Ht 63.0 in | Wt 112.0 lb

## 2018-06-30 DIAGNOSIS — S4992XA Unspecified injury of left shoulder and upper arm, initial encounter: Secondary | ICD-10-CM

## 2018-06-30 DIAGNOSIS — M79622 Pain in left upper arm: Secondary | ICD-10-CM | POA: Diagnosis not present

## 2018-06-30 DIAGNOSIS — M898X2 Other specified disorders of bone, upper arm: Secondary | ICD-10-CM

## 2018-06-30 NOTE — Progress Notes (Signed)
Subjective:    Patient ID: Traci Cooper, female    DOB: 07-18-1980, 38 y.o.   MRN: [id number]  HPI   Patient presents to clinic complaining of pain and "lumpiness" in left upper extremity.  [place] she ended up hitting her left arm on the edge of the diving board.  Patient states that it was painful, and her arm was bruised up for a long time.  The bruise took many weeks to resolve, now patient continues to feel pain in left humerus region as well as a lumpiness of her soft tissue/skin.  Skin did not break open with a fall, no current breaks in skin.  No fever or chills.  Range of motion of upper extremity is completely intact.  Skin color is now normal.  Patient Active Problem List   Diagnosis Date Noted  . GAD (generalized anxiety disorder) 03/11/2018  . Episode of recurrent major depressive disorder (HCC) 10/16/2017  . Preventative health care 05/03/2016  . Esophageal pain 04/08/2016   Social History   Tobacco Use  . Smoking status: Never Smoker  . Smokeless tobacco: Never Used  Substance Use Topics  . Alcohol use: No    Review of Systems  Constitutional: Negative for chills, fatigue and fever.  HENT: Negative for congestion, ear pain, sinus pain and sore throat.   Eyes: Negative.   Respiratory: Negative for cough, shortness of breath and wheezing.   Cardiovascular: Negative for chest pain, palpitations and leg swelling.  Gastrointestinal: Negative for abdominal pain, diarrhea, nausea and vomiting.  Genitourinary: Negative for dysuria, frequency and urgency.  Musculoskeletal: +pain in LUE after fall/injury  Skin: Negative for color change, pallor and rash.  Neurological: Negative for syncope, light-headedness and headaches.  Psychiatric/Behavioral: The patient is not nervous/anxious.       Objective:   Physical Exam Vitals signs and nursing note reviewed.  Constitutional:    Appearance: Normal appearance.  HENT:     Head: Normocephalic.  Eyes:     Extraocular Movements: Extraocular movements intact.     Conjunctiva/sclera: Conjunctivae normal.  Neck:     Musculoskeletal: Normal range of motion. No neck rigidity.  Cardiovascular:     Rate and Rhythm: Normal rate.  Pulmonary:     Effort: Pulmonary effort is normal. No respiratory distress.  Musculoskeletal: Normal range of motion.        General: No deformity.       Arms:     Comments: Location of tenderness on left upper extremity indicated by red circle on diagram.  No obvious deformity or changes seen with the eye or felt on palpation.  I do not feel any obvious lump or mass in the region.  Skin:    General: Skin is warm and dry.     Coloration: Skin is not jaundiced or pale.     Findings: No bruising, erythema, lesion or rash.  Neurological:     Mental Status: She is alert and oriented to person, place, and time.  Psychiatric:        Mood and Affect: Mood normal.        Behavior: Behavior normal.       Vitals:   06/30/18 0918  BP: 98/62  Pulse: 80  Temp: 98.3 F (36.8 C)  SpO2: 97%   Assessment & Plan:   Pain of left humerus, injury of left humerus - we will get x-ray of left humerus  to rule out any old fracture.  I wonder if patient did have a small fracture, that has now healed which could be contributing to the continued pain.  Due to patient feeling a lumpy sensation of skin/tissue in the area, we will get ultrasound to further investigate.   Patient aware she will be contacted with results of her x-ray and for scheduling of her ultrasound.  Advised she can use Tylenol or Motrin as needed for pain.  Follow-up with PCP as regularly scheduled.

## 2018-07-31 ENCOUNTER — Telehealth: Payer: Self-pay

## 2018-07-31 NOTE — Telephone Encounter (Signed)
Copied from CRM [id number]. Topic: General - Other >> Jul 31, 2018  3:27 PM Jaquita Rector A wrote: Reason for CRM: Patient partner called to check on status of Xrays from 06/30/2018 he also states that she was to be called for an appointment to have an ultrasound on that arm. It has been one month since patient saw Leanora Cover and they would like a call back with an update please. Ph# [phone]

## 2018-07-31 NOTE — Telephone Encounter (Signed)
Called Pt and she stated that no one ever called her to set up an Ultrasound appt.

## 2018-07-31 NOTE — Telephone Encounter (Signed)
[place] order is in epic  Will forward to [place] to see if she knows status

## 2018-08-04 NOTE — Telephone Encounter (Signed)
Pt was called and vm left for pt to call ofc. :)

## 2018-12-23 ENCOUNTER — Telehealth: Payer: Self-pay

## 2018-12-23 ENCOUNTER — Encounter: Payer: Self-pay | Admitting: Family Medicine

## 2018-12-23 ENCOUNTER — Telehealth: Payer: Self-pay | Admitting: *Deleted

## 2018-12-23 ENCOUNTER — Other Ambulatory Visit: Payer: Self-pay

## 2018-12-23 ENCOUNTER — Ambulatory Visit (INDEPENDENT_AMBULATORY_CARE_PROVIDER_SITE_OTHER): Payer: Commercial Managed Care - PPO | Admitting: Family Medicine

## 2018-12-23 VITALS — Temp 97.6°F | Wt 110.2 lb

## 2018-12-23 DIAGNOSIS — A084 Viral intestinal infection, unspecified: Secondary | ICD-10-CM

## 2018-12-23 DIAGNOSIS — Z20822 Contact with and (suspected) exposure to covid-19: Secondary | ICD-10-CM

## 2018-12-23 DIAGNOSIS — R6889 Other general symptoms and signs: Secondary | ICD-10-CM

## 2018-12-23 NOTE — Telephone Encounter (Signed)
See visit from today

## 2018-12-23 NOTE — Progress Notes (Signed)
I connected with Traci Cooper on 12/23/18 at 11:20 AM EDT by telephone and verified that I am speaking with the correct person using two identifiers.   I discussed the limitations, risks, security and privacy concerns of performing an evaluation and management service by video and the availability of in person appointments. I also discussed with the patient that there may be a patient responsible charge related to this service. The patient expressed understanding and agreed to proceed.  Patient location: Home Provider Location: [place] Participants: Lynnda ChildJessica Cooper  and Traci Cooper   Subjective:     Traci Cooper is a 38 y.o. female presenting for Diarrhea (Pt has felt sick x 2 days; yesterday temp 99.4,chills, nausea and vomiting; watery diarrhea,scratchy thraot. Pt travelled to [place] 12/04/18 - 12/09/18)     Diarrhea  This is a new problem. The current episode started yesterday. The problem occurs 2 to 4 times per day. The problem has been waxing and waning. The stool consistency is described as watery. Associated symptoms include chills, coughing (on and off), a fever, sweats and vomiting. Pertinent negatives include no abdominal pain, arthralgias, headaches or myalgias. She has tried increased fluids and analgesics for the symptoms. The treatment provided mild relief.   No known sick contact Did go to [place] to visit family - drove Has been wearing masks out in public  Partner just returned from [place]   Review of Systems  Constitutional: Positive for chills, diaphoresis and fever.  HENT: Positive for sneezing and sore throat. Negative for congestion and sinus pain.   Respiratory: Positive for cough (on and off).   Gastrointestinal: Positive for diarrhea, nausea and vomiting. Negative for abdominal pain.  Musculoskeletal: Negative for arthralgias and myalgias.  Neurological: Negative for headaches.     Social History   Tobacco Use   Smoking Status Never Smoker  Smokeless Tobacco Never Used        Objective:   BP Readings from Last 3 Encounters:  06/30/18 98/62  04/07/18 110/74  03/11/18 124/76   Wt Readings from Last 3 Encounters:  12/23/18 110 lb 4 oz (50 kg)  06/30/18 112 lb (50.8 kg)  04/07/18 106 lb 8 oz (48.3 kg)   Temp 97.6 F (36.4 C) Comment: per patient  Wt 110 lb 4 oz (50 kg) Comment: per patient  BMI 19.53 kg/m    Physical Exam Constitutional:      Appearance: Normal appearance. She is not ill-appearing.  HENT:     Head: Normocephalic and atraumatic.     Right Ear: External ear normal.     Left Ear: External ear normal.  Eyes:     Conjunctiva/sclera: Conjunctivae normal.  Pulmonary:     Effort: Pulmonary effort is normal. No respiratory distress.  Neurological:     Mental Status: She is alert. Mental status is at baseline.  Psychiatric:        Mood and Affect: Mood normal.        Behavior: Behavior normal.        Thought Content: Thought content normal.        Judgment: Judgment normal.             Assessment & Plan:   Problem List Items Addressed This Visit    None    Visit Diagnoses    Viral gastroenteritis    -  Primary   Suspected Covid-19 Virus Infection          Discussed that likely a viral  syndrome and given pandemic would recommend testing for covid-19 Return/ER precautions Sips of fluid and bland diet Referred for testing   Interactive audio and video telecommunications were attempted between this provider and patient, however failed, due to patient having technical difficulties OR patient did not have access to video capability.  We continued and completed visit with audio only. Was able to have video for a short period of time. Well appearing patient    No follow-ups on file.  Traci Noe, MD

## 2018-12-23 NOTE — Telephone Encounter (Signed)
Pt has felt sick x 2 days; today temp 99.4,chills, nausea and vomiting; watery diarrhea,scratchy thraot. Pt travelled to [place] 12/04/18 - 12/09/18.pt has virtual appt with Dr Einar Pheasant today at 11:20.

## 2018-12-23 NOTE — Telephone Encounter (Signed)
-----   Message from Lesleigh Noe, MD sent at 12/23/2018 10:47 AM EDT ----- Fever, chills, diarrhea  Please test for Covid-19

## 2018-12-23 NOTE — Telephone Encounter (Signed)
Pt scheduled for covid testing today @ 1:30 @ [facility]. Instructions given and order placed  

## 2018-12-28 LAB — NOVEL CORONAVIRUS, NAA: SARS-CoV-2, NAA: NOT DETECTED

## 2019-01-24 ENCOUNTER — Other Ambulatory Visit: Payer: Self-pay | Admitting: Primary Care

## 2019-01-24 DIAGNOSIS — Z0000 Encounter for general adult medical examination without abnormal findings: Secondary | ICD-10-CM

## 2019-02-10 ENCOUNTER — Other Ambulatory Visit (INDEPENDENT_AMBULATORY_CARE_PROVIDER_SITE_OTHER): Payer: Commercial Managed Care - PPO

## 2019-02-10 DIAGNOSIS — Z0000 Encounter for general adult medical examination without abnormal findings: Secondary | ICD-10-CM | POA: Diagnosis not present

## 2019-02-10 LAB — LIPID PANEL
Cholesterol: 251 mg/dL — ABNORMAL HIGH (ref 0–200)
HDL: 78.9 mg/dL (ref >39.00)
LDL Cholesterol: 161 mg/dL — ABNORMAL HIGH (ref 0–99)
NonHDL: 172.55
Total CHOL/HDL Ratio: 3
Triglycerides: 57 mg/dL (ref 0.0–149.0)
VLDL: 11.4 mg/dL (ref 0.0–40.0)

## 2019-02-10 LAB — COMPREHENSIVE METABOLIC PANEL
ALT: 11 U/L (ref 0–35)
AST: 15 U/L (ref 0–37)
Albumin: 4.5 g/dL (ref 3.5–5.2)
Alkaline Phosphatase: 36 U/L — ABNORMAL LOW (ref 39–117)
BUN: 14 mg/dL (ref 6–23)
CO2: 26 mEq/L (ref 19–32)
Calcium: 9.1 mg/dL (ref 8.4–10.5)
Chloride: 105 mEq/L (ref 96–112)
Creatinine, Ser: 0.57 mg/dL (ref 0.40–1.20)
GFR: 119.03 mL/min (ref >60.00)
Glucose, Bld: 90 mg/dL (ref 70–99)
Potassium: 4.3 mEq/L (ref 3.5–5.1)
Sodium: 138 mEq/L (ref 135–145)
Total Bilirubin: 0.4 mg/dL (ref 0.2–1.2)
Total Protein: 7.3 g/dL (ref 6.0–8.3)

## 2019-02-18 ENCOUNTER — Other Ambulatory Visit: Payer: Self-pay

## 2019-02-18 ENCOUNTER — Ambulatory Visit (INDEPENDENT_AMBULATORY_CARE_PROVIDER_SITE_OTHER): Payer: Commercial Managed Care - PPO | Admitting: Primary Care

## 2019-02-18 ENCOUNTER — Encounter: Payer: Self-pay | Admitting: Primary Care

## 2019-02-18 VITALS — BP 114/72 | HR 72 | Temp 97.9°F | Ht 63.0 in | Wt 112.5 lb

## 2019-02-18 DIAGNOSIS — F411 Generalized anxiety disorder: Secondary | ICD-10-CM

## 2019-02-18 DIAGNOSIS — E785 Hyperlipidemia, unspecified: Secondary | ICD-10-CM | POA: Diagnosis not present

## 2019-02-18 DIAGNOSIS — Z0000 Encounter for general adult medical examination without abnormal findings: Secondary | ICD-10-CM | POA: Diagnosis not present

## 2019-02-18 MED ORDER — DULOXETINE HCL 60 MG PO CPEP: 60.0000 mg | ORAL_CAPSULE | Freq: Every day | ORAL | 1 refills | Status: DC

## 2019-02-18 NOTE — Assessment & Plan Note (Signed)
LDL of 161 which is an increase form 144 from 2019. HDL of 78 which is good.  Given her thin frame and overall healthy diet I suspect that this is familial. She has a family history of hyperlipidemia in her mother.   Continue to monitor. Repeat in one year. Increase exercise.

## 2019-02-18 NOTE — Assessment & Plan Note (Signed)
Tetanus UTD, declines influenza vaccination. Pap smear UTD. Encouraged to start exercising, continue to work on a healthy diet. Exam today unremarkable. Labs reviewed. Follow up in 1 year.

## 2019-02-18 NOTE — Assessment & Plan Note (Signed)
Continued anxiety despite Cymbalta 30 mg, but overall improved. She would like to try a dose increase as she did well on 60 mg in the past.  Rx for Cymbalta 60 mg sent to pharmacy, she will update in one month.

## 2019-02-18 NOTE — Patient Instructions (Signed)
Start exercising. You should be getting 150 minutes of moderate intensity exercise weekly.  Continue to work on a healthy diet.  We've increased the dose of your Cymbalta to 60 mg, you may take two of your 30 mg capsules to equal 60 mg. I sent a new prescription to your mail order pharmacy for 60 mg.  Please update me in regards to your anxiety in one month.  For itchy scalp you can try Selsun Blu. Apply this three times weekly in the shower, try to leave in for 3-5 minutes before washing.  It was a pleasure to see you today!   Preventive Care 19-2 Years Old, Female Preventive care refers to visits with your health care provider and lifestyle choices that can promote health and wellness. This includes:  A yearly physical exam. This may also be called an annual well check.  Regular dental visits and eye exams.  Immunizations.  Screening for certain conditions.  Healthy lifestyle choices, such as eating a healthy diet, getting regular exercise, not using drugs or products that contain nicotine and tobacco, and limiting alcohol use. What can I expect for my preventive care visit? Physical exam Your health care provider will check your:  Height and weight. This may be used to calculate body mass index (BMI), which tells if you are at a healthy weight.  Heart rate and blood pressure.  Skin for abnormal spots. Counseling Your health care provider may ask you questions about your:  Alcohol, tobacco, and drug use.  Emotional well-being.  Home and relationship well-being.  Sexual activity.  Eating habits.  Work and work Statistician.  Method of birth control.  Menstrual cycle.  Pregnancy history. What immunizations do I need?  Influenza (flu) vaccine  This is recommended every year. Tetanus, diphtheria, and pertussis (Tdap) vaccine  You may need a Td booster every 10 years. Varicella (chickenpox) vaccine  You may need this if you have not been vaccinated. Human  papillomavirus (HPV) vaccine  If recommended by your health care provider, you may need three doses over 6 months. Measles, mumps, and rubella (MMR) vaccine  You may need at least one dose of MMR. You may also need a second dose. Meningococcal conjugate (MenACWY) vaccine  One dose is recommended if you are age 22-21 years and a first-year college student living in a residence hall, or if you have one of several medical conditions. You may also need additional booster doses. Pneumococcal conjugate (PCV13) vaccine  You may need this if you have certain conditions and were not previously vaccinated. Pneumococcal polysaccharide (PPSV23) vaccine  You may need one or two doses if you smoke cigarettes or if you have certain conditions. Hepatitis A vaccine  You may need this if you have certain conditions or if you travel or work in places where you may be exposed to hepatitis A. Hepatitis B vaccine  You may need this if you have certain conditions or if you travel or work in places where you may be exposed to hepatitis B. Haemophilus influenzae type b (Hib) vaccine  You may need this if you have certain conditions. You may receive vaccines as individual doses or as more than one vaccine together in one shot (combination vaccines). Talk with your health care provider about the risks and benefits of combination vaccines. What tests do I need?  Blood tests  Lipid and cholesterol levels. These may be checked every 5 years starting at age 55.  Hepatitis C test.  Hepatitis B test. Screening  Diabetes screening. This is done by checking your blood sugar (glucose) after you have not eaten for a while (fasting).  Sexually transmitted disease (STD) testing.  BRCA-related cancer screening. This may be done if you have a family history of breast, ovarian, tubal, or peritoneal cancers.  Pelvic exam and Pap test. This may be done every 3 years starting at age 63. Starting at age 50, this may be  done every 5 years if you have a Pap test in combination with an HPV test. Talk with your health care provider about your test results, treatment options, and if necessary, the need for more tests. Follow these instructions at home: Eating and drinking   Eat a diet that includes fresh fruits and vegetables, whole grains, lean protein, and low-fat dairy.  Take vitamin and mineral supplements as recommended by your health care provider.  Do not drink alcohol if: ? Your health care provider tells you not to drink. ? You are pregnant, may be pregnant, or are planning to become pregnant.  If you drink alcohol: ? Limit how much you have to 0-1 drink a day. ? Be aware of how much alcohol is in your drink. In the [place] one drink equals one 12 oz bottle of beer (355 mL), one 5 oz glass of wine (148 mL), or one 1 oz glass of hard liquor (44 mL). Lifestyle  Take daily care of your teeth and gums.  Stay active. Exercise for at least 30 minutes on 5 or more days each week.  Do not use any products that contain nicotine or tobacco, such as cigarettes, e-cigarettes, and chewing tobacco. If you need help quitting, ask your health care provider.  If you are sexually active, practice safe sex. Use a condom or other form of birth control (contraception) in order to prevent pregnancy and STIs (sexually transmitted infections). If you plan to become pregnant, see your health care provider for a preconception visit. What's next?  Visit your health care provider once a year for a well check visit.  Ask your health care provider how often you should have your eyes and teeth checked.  Stay up to date on all vaccines. This information is not intended to replace advice given to you by your health care provider. Make sure you discuss any questions you have with your health care provider. Document Released: 08/06/2001 Document Revised: 02/19/2018 Document Reviewed: 02/19/2018 Elsevier Patient Education  2020  [employer].

## 2019-02-18 NOTE — Progress Notes (Signed)
Subjective:    Patient ID: Traci Cooper, female    DOB: 05/17/1981, 38 y.o.   MRN: [id number]  HPI  Traci Cooper is a 38 year old female who presents today for complete physical.   She would like to discuss a dose increase in her Cymbalta to 60 mg. She continues to have chronic anxiety that has overall improved some on the 30 mg dose. She was managed on 60 mg in the past and felt that this did very well to reduce her symptoms. She has noticed that she's scratching more at her scalp due to anxiety. She's done this for years but worse over the last several months.   Immunizations: -Tetanus: Completed in 2014 -Influenza: Declines  Diet: She endorses a healthy diet. She is eating vegetables, salads, chicken, sausage, starch, little fast/friend food. Drinking mostly water, hot tea, coffee. Desserts/sweets twice weekly. Exercise: She is walking some, no regular exercise  Eye exam: Several years ago Dental exam: Completes semi-annually  Pap Smear: Completed in 2019, negative. Due in 2022   Review of Systems  Constitutional: Negative for unexpected weight change.  HENT: Negative for rhinorrhea.   Respiratory: Negative for cough and shortness of breath.   Cardiovascular: Negative for chest pain.  Gastrointestinal: Negative for constipation and diarrhea.  Genitourinary: Negative for difficulty urinating and menstrual problem.  Musculoskeletal: Negative for arthralgias and myalgias.  Skin: Negative for rash.  Allergic/Immunologic: Negative for environmental allergies.  Neurological: Negative for dizziness, numbness and headaches.  Psychiatric/Behavioral:       Continuous anxiety, see HPI       Past Medical History:  Diagnosis Date  . Chicken pox   . GERD (gastroesophageal reflux disease)   . Kidney stones   . Urinary tract infection      Social History   Socioeconomic History  . Marital status: Single    Spouse name: Not on file  . Number of children: Not on file  . Years  of education: Not on file  . Highest education level: Not on file  Occupational History  . Not on file  Social Needs  . Financial resource strain: Not on file  . Food insecurity    Worry: Not on file    Inability: Not on file  . Transportation needs    Medical: Not on file    Non-medical: Not on file  Tobacco Use  . Smoking status: Never Smoker  . Smokeless tobacco: Never Used  Substance and Sexual Activity  . Alcohol use: No  . Drug use: No  . Sexual activity: Not on file  Lifestyle  . Physical activity    Days per week: Not on file    Minutes per session: Not on file  . Stress: Not on file  Relationships  . Social Herbalist on phone: Not on file    Gets together: Not on file    Attends religious service: Not on file    Active member of club or organization: Not on file    Attends meetings of clubs or organizations: Not on file    Relationship status: Not on file  . Intimate partner violence    Fear of current or ex partner: Not on file    Emotionally abused: Not on file    Physically abused: Not on file    Forced sexual activity: Not on file  Other Topics Concern  . Not on file  Social History Narrative   Single.   No children.  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    Enjoys walking, Diplomatic Services operational officerphotography.    From [place]     Past Surgical History:  Procedure Laterality Date  . APPENDECTOMY  1996 or 1997    Family History  Problem Relation Age of Onset  . Stroke Maternal Grandfather   . Hypertension Maternal Grandfather     Allergies  Allergen Reactions  . Erythromycin   . Tetracyclines & Related Nausea And Vomiting    No current outpatient medications on file prior to visit.   No current facility-administered medications on file prior to visit.     BP 114/72   Pulse 72   Temp 97.9 F (36.6 C) (Temporal)   Ht 5\' 3"  (1.6 m)   Wt 112 lb 8 oz (51 kg)   LMP 02/17/2019   SpO2 98%   BMI 19.93 kg/m    Objective:   Physical Exam   Constitutional: She is oriented to person, place, and time. She appears well-nourished.  HENT:  Right Ear: Tympanic membrane and ear canal normal.  Left Ear: Tympanic membrane and ear canal normal.  Mouth/Throat: Oropharynx is clear and moist.  Eyes: Pupils are equal, round, and reactive to light. EOM are normal.  Neck: Neck supple.  Cardiovascular: Normal rate and regular rhythm.  Respiratory: Effort normal and breath sounds normal.  GI: Soft. Bowel sounds are normal. There is no abdominal tenderness.  Musculoskeletal: Normal range of motion.  Neurological: She is alert and oriented to person, place, and time.  Skin: Skin is warm and dry.  Psychiatric: She has a normal mood and affect.           Assessment & Plan:

## 2019-04-19 ENCOUNTER — Telehealth: Payer: Self-pay | Admitting: Primary Care

## 2019-04-19 NOTE — Telephone Encounter (Signed)
Please advise. Should I go ahead asked if patient need a referral to OB/GYN

## 2019-04-19 NOTE — Telephone Encounter (Signed)
Message left for patient to return my call.  

## 2019-04-19 NOTE — Telephone Encounter (Signed)
Yes, we can see her in the office for HCG blood testing. We can also refer to GYN at that time. Please schedule.

## 2019-04-19 NOTE — Telephone Encounter (Signed)
Patient's husband returned call for his wife.  Stated that the patient is in meetings all day and she is not able to answer the phone.   C/B #  [phone]

## 2019-04-19 NOTE — Telephone Encounter (Signed)
Patient would like to know if we are able to do a first prenatal exam.  She took 2 pregnancy test over the weekend and both came back positive.   Please advise

## 2019-04-20 ENCOUNTER — Encounter: Payer: Self-pay | Admitting: Primary Care

## 2019-04-20 ENCOUNTER — Ambulatory Visit: Payer: Commercial Managed Care - PPO | Admitting: Primary Care

## 2019-04-20 ENCOUNTER — Other Ambulatory Visit: Payer: Self-pay

## 2019-04-20 VITALS — BP 110/64 | HR 92 | Temp 98.0°F | Ht 63.0 in | Wt 113.5 lb

## 2019-04-20 DIAGNOSIS — Z3201 Encounter for pregnancy test, result positive: Secondary | ICD-10-CM

## 2019-04-20 NOTE — Patient Instructions (Signed)
Stop by the lab prior to leaving today. I will notify you of your results once received.   Start prenatal vitamins today.  Call the OB/GYN offices today for an appointment. Please notify me if you have trouble connecting.  It was a pleasure to see you today! Congratulations!    Prenatal Care Prenatal care is health care during pregnancy. It helps you and your unborn baby (fetus) stay as healthy as possible. Prenatal care may be provided by a midwife, a family practice health care provider, or a childbirth and pregnancy specialist (obstetrician). How does this affect me? During pregnancy, you will be closely monitored for any new conditions that might develop. To lower your risk of pregnancy complications, you and your health care provider will talk about any underlying conditions you have. How does this affect my baby? Early and consistent prenatal care increases the chance that your baby will be healthy during pregnancy. Prenatal care lowers the risk that your baby will be:  Born early (prematurely).  Smaller than expected at birth (small for gestational age). What can I expect at the first prenatal care visit? Your first prenatal care visit will likely be the longest. You should schedule your first prenatal care visit as soon as you know that you are pregnant. Your first visit is a good time to talk about any questions or concerns you have about pregnancy. At your visit, you and your health care provider will talk about:  Your medical history, including: ? Any past pregnancies. ? Your family's medical history. ? The baby's father's medical history. ? Any long-term (chronic) health conditions you have and how you manage them. ? Any surgeries or procedures you have had. ? Any current over-the-counter or prescription medicines, herbs, or supplements you are taking.  Other factors that could pose a risk to your baby, including:  Your home setting and your stress levels, including: ?  Exposure to abuse or violence. ? Household financial strain. ? Mental health conditions you have.  Your daily health habits, including diet and exercise. Your health care provider will also:  Measure your weight, height, and blood pressure.  Do a physical exam, including a pelvic and breast exam.  Perform blood tests and urine tests to check for: ? Urinary tract infection. ? Sexually transmitted infections (STIs). ? Low iron levels in your blood (anemia). ? Blood type and certain proteins on red blood cells (Rh antibodies). ? Infections and immunity to viruses, such as hepatitis B and rubella. ? HIV (human immunodeficiency virus).  Do an ultrasound to confirm your baby's growth and development and to help predict your estimated due date (EDD). This ultrasound is done with a probe that is inserted into the vagina (transvaginal ultrasound).  Discuss your options for genetic screening.  Give you information about how to keep yourself and your baby healthy, including: ? Nutrition and taking vitamins. ? Physical activity. ? How to manage pregnancy symptoms such as nausea and vomiting (morning sickness). ? Infections and substances that may be harmful to your baby and how to avoid them. ? Food safety. ? Dental care. ? Working. ? Travel. ? Warning signs to watch for and when to call your health care provider. How often will I have prenatal care visits? After your first prenatal care visit, you will have regular visits throughout your pregnancy. The visit schedule is often as follows:  Up to week 28 of pregnancy: once every 4 weeks.  28-36 weeks: once every 2 weeks.  After 36 weeks: every  week until delivery. Some women may have visits more or less often depending on any underlying health conditions and the health of the baby. Keep all follow-up and prenatal care visits as told by your health care provider. This is important. What happens during routine prenatal care visits? Your  health care provider will:  Measure your weight and blood pressure.  Check for fetal heart sounds.  Measure the height of your uterus in your abdomen (fundal height). This may be measured starting around week 20 of pregnancy.  Check the position of your baby inside your uterus.  Ask questions about your diet, sleeping patterns, and whether you can feel the baby move.  Review warning signs to watch for and signs of labor.  Ask about any pregnancy symptoms you are having and how you are dealing with them. Symptoms may include: ? Headaches. ? Nausea and vomiting. ? Vaginal discharge. ? Swelling. ? Fatigue. ? Constipation. ? Any discomfort, including back or pelvic pain. Make a list of questions to ask your health care provider at your routine visits. What tests might I have during prenatal care visits? You may have blood, urine, and imaging tests throughout your pregnancy, such as:  Urine tests to check for glucose, protein, or signs of infection.  Glucose tests to check for a form of diabetes that can develop during pregnancy (gestational diabetes mellitus). This is usually done around week 24 of pregnancy.  An ultrasound to check your baby's growth and development and to check for birth defects. This is usually done around week 20 of pregnancy.  A test to check for group B strep (GBS) infection. This is usually done around week 36 of pregnancy.  Genetic testing. This may include blood or imaging tests, such as an ultrasound. Some genetic tests are done during the first trimester and some are done during the second trimester. What else can I expect during prenatal care visits? Your health care provider may recommend getting certain vaccines during pregnancy. These may include:  A yearly flu shot (annual influenza vaccine). This is especially important if you will be pregnant during flu season.  Tdap (tetanus, diphtheria, pertussis) vaccine. Getting this vaccine during pregnancy  can protect your baby from whooping cough (pertussis) after birth. This vaccine may be recommended between weeks 27 and 36 of pregnancy. Later in your pregnancy, your health care provider may give you information about:  Childbirth and breastfeeding classes.  Choosing a health care provider for your baby.  Umbilical cord banking.  Breastfeeding.  Birth control after your baby is born.  The hospital labor and delivery unit and how to tour it.  Registering at the hospital before you go into labor. Where to find more information  Office on Women's Health: LegalWarrants.gl  American Pregnancy Association: americanpregnancy.org  March of Dimes: marchofdimes.org Summary  Prenatal care helps you and your baby stay as healthy as possible during pregnancy.  Your first prenatal care visit will most likely be the longest.  You will have visits and tests throughout your pregnancy to monitor your health and your baby's health.  Bring a list of questions to your visits to ask your health care provider.  Make sure to keep all follow-up and prenatal care visits with your health care provider. This information is not intended to replace advice given to you by your health care provider. Make sure you discuss any questions you have with your health care provider. Document Released: 06/13/2003 Document Revised: 09/30/2018 Document Reviewed: 06/09/2017 Elsevier Patient Education  2020 Elsevier Inc.  

## 2019-04-20 NOTE — Progress Notes (Signed)
Subjective:    Patient ID: Traci Cooper, female    DOB: 1980/08/15, 38 y.o.   MRN: [id number]  HPI  Traci Cooper is a 38 year old female with a history of anxiety disorder, MDD who presents today to discuss positive pregnancy tests.  She has had three positive home pregnancy tests over the last week. LMP was September 22nd, 2020.  She's noticed some pelvic cramping, mild breast tenderness, urinary frequency, heightened sense of smell. She has noticed some brownish/pink discharge last week that was mild.   She is not on prenatal vitamins. She is still taking duloxetine 60 mg daily and is doing well overall. She is willing to reduce down to 30 mg. She has a few names of OB/GYN offices and will call to get an appointment.   Review of Systems  Constitutional: Negative for fever.  Genitourinary: Positive for frequency and vaginal discharge. Negative for vaginal bleeding.       Mild pelvic cramping       Past Medical History:  Diagnosis Date  . Chicken pox   . GERD (gastroesophageal reflux disease)   . Kidney stones   . Urinary tract infection      Social History   Socioeconomic History  . Marital status: Single    Spouse name: Not on file  . Number of children: Not on file  . Years of education: Not on file  . Highest education level: Not on file  Occupational History  . Not on file  Social Needs  . Financial resource strain: Not on file  . Food insecurity    Worry: Not on file    Inability: Not on file  . Transportation needs    Medical: Not on file    Non-medical: Not on file  Tobacco Use  . Smoking status: Never Smoker  . Smokeless tobacco: Never Used  Substance and Sexual Activity  . Alcohol use: No  . Drug use: No  . Sexual activity: Not on file  Lifestyle  . Physical activity    Days per week: Not on file    Minutes per session: Not on file  . Stress: Not on file  Relationships  . Social Musician on phone: Not on file    Gets together: Not  on file    Attends religious service: Not on file    Active member of club or organization: Not on file    Attends meetings of clubs or organizations: Not on file    Relationship status: Not on file  . Intimate partner violence    Fear of current or ex partner: Not on file    Emotionally abused: Not on file    Physically abused: Not on file    Forced sexual activity: Not on file  Other Topics Concern  . Not on file  Social History Narrative   Single.   No children.    Works in [employer] as a Education officer, museum.    Enjoys walking, Diplomatic Services operational officer.    [place]     Past Surgical History:  Procedure Laterality Date  . APPENDECTOMY  1996 or 1997    Family History  Problem Relation Age of Onset  . Stroke Maternal Grandfather   . Hypertension Maternal Grandfather     Allergies  Allergen Reactions  . Erythromycin   . Tetracyclines & Related Nausea And Vomiting    Current Outpatient Medications on File Prior to Visit  Medication Sig Dispense Refill  . DULoxetine (CYMBALTA) 60  MG capsule Take 1 capsule (60 mg total) by mouth daily. For anxiety and depression. 90 capsule 1   No current facility-administered medications on file prior to visit.     BP 110/64   Pulse 92   Temp 98 F (36.7 C) (Temporal)   Ht 5\' 3"  (1.6 m)   Wt 113 lb 8 oz (51.5 kg)   LMP 04/15/2019   SpO2 98%   BMI 20.11 kg/m    Objective:   Physical Exam  Constitutional: She appears well-nourished.  Neck: Neck supple.  Cardiovascular: Normal rate and regular rhythm.  Respiratory: Effort normal and breath sounds normal.  Skin: Skin is warm and dry.  Psychiatric: She has a normal mood and affect.           Assessment & Plan:

## 2019-04-20 NOTE — Assessment & Plan Note (Signed)
LMP 03/16/19, three positive home pregnancy tests. HCG quantitative testing pending.  Discussed to start prenatal vitamins today. She will call OB/GYN offices to get connected.  Will trial reducing duloxetine to 30 mg, will defer recommendations for continuation to future OB/GYN.

## 2019-04-22 LAB — HCG, QUANTITATIVE, PREGNANCY: Quantitative HCG: 7282 m[IU]/mL

## 2019-04-26 ENCOUNTER — Encounter: Payer: Self-pay | Admitting: Emergency Medicine

## 2019-04-26 ENCOUNTER — Other Ambulatory Visit: Payer: Self-pay

## 2019-04-26 ENCOUNTER — Emergency Department: Payer: Commercial Managed Care - PPO

## 2019-04-26 ENCOUNTER — Emergency Department
Admission: EM | Admit: 2019-04-26 | Discharge: 2019-04-26 | Disposition: A | Discharge status: Home / Self Care | Payer: Commercial Managed Care - PPO | Attending: Emergency Medicine | Admitting: Emergency Medicine

## 2019-04-26 DIAGNOSIS — Z79899 Other long term (current) drug therapy: Secondary | ICD-10-CM | POA: Insufficient documentation

## 2019-04-26 DIAGNOSIS — Z3A01 Less than 8 weeks gestation of pregnancy: Secondary | ICD-10-CM | POA: Diagnosis not present

## 2019-04-26 DIAGNOSIS — O209 Hemorrhage in early pregnancy, unspecified: Secondary | ICD-10-CM | POA: Insufficient documentation

## 2019-04-26 DIAGNOSIS — O4690 Antepartum hemorrhage, unspecified, unspecified trimester: Secondary | ICD-10-CM

## 2019-04-26 LAB — CBC
HCT: 40.9 % (ref 36.0–46.0)
Hemoglobin: 12.9 g/dL (ref 12.0–15.0)
MCH: 25.6 pg — ABNORMAL LOW (ref 26.0–34.0)
MCHC: 31.5 g/dL (ref 30.0–36.0)
MCV: 81.2 fL (ref 80.0–100.0)
Platelets: 311 10*3/uL (ref 150–400)
RBC: 5.04 MIL/uL (ref 3.87–5.11)
RDW: 13 % (ref 11.5–15.5)
WBC: 6.2 10*3/uL (ref 4.0–10.5)
nRBC: 0 % (ref 0.0–0.2)

## 2019-04-26 LAB — COMPREHENSIVE METABOLIC PANEL
ALT: 19 U/L (ref 0–44)
AST: 21 U/L (ref 15–41)
Albumin: 4.9 g/dL (ref 3.5–5.0)
Alkaline Phosphatase: 39 U/L (ref 38–126)
Anion gap: 11 (ref 5–15)
BUN: 10 mg/dL (ref 6–20)
CO2: 25 mmol/L (ref 22–32)
Calcium: 9.8 mg/dL (ref 8.9–10.3)
Chloride: 101 mmol/L (ref 98–111)
Creatinine, Ser: 0.45 mg/dL (ref 0.44–1.00)
GFR calc Af Amer: >60 mL/min (ref >60)
GFR calc non Af Amer: >60 mL/min (ref >60)
Glucose, Bld: 101 mg/dL — ABNORMAL HIGH (ref 70–99)
Potassium: 3.5 mmol/L (ref 3.5–5.1)
Sodium: 137 mmol/L (ref 135–145)
Total Bilirubin: 0.5 mg/dL (ref 0.3–1.2)
Total Protein: 9 g/dL — ABNORMAL HIGH (ref 6.5–8.1)

## 2019-04-26 LAB — ABO/RH: ABO/RH(D): AB POS

## 2019-04-26 LAB — HCG, QUANTITATIVE, PREGNANCY: hCG, Beta Chain, Quant, S: 20065 m[IU]/mL — ABNORMAL HIGH (ref <5)

## 2019-04-26 NOTE — ED Provider Notes (Signed)
[facility] Emergency Department Provider Note   ____________________________________________    I have reviewed the triage vital signs and the nursing notes.   HISTORY  Chief Complaint Possible Pregnancy and Vaginal Bleeding     HPI Traci Cooper is a 38 y.o. female who presents with vaginal bleeding and mild left lower quadrant cramping sensation.  Patient reports over the weekend she had vaginal bleeding with some clotting, she reports that she is having some mild spotting.  She does describe some cramping in her left lower quadrant.  She reports she is about [redacted] weeks pregnant, this is her first pregnancy, referred to the ED by OB.  No fevers chills, no dysuria  Past Medical History:  Diagnosis Date  . Chicken pox   . GERD (gastroesophageal reflux disease)   . Kidney stones   . Urinary tract infection     Patient Active Problem List   Diagnosis Date Noted  . Positive pregnancy test 04/20/2019  . Hyperlipidemia 02/18/2019  . GAD (generalized anxiety disorder) 03/11/2018  . 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 Preventative health care 05/03/2016  . Esophageal pain 04/08/2016    Past Surgical History:  Procedure Laterality Date  . APPENDECTOMY  1996 or 1997    Prior to Admission medications   Medication Sig Start Date End Date Taking? Authorizing Provider  DULoxetine (CYMBALTA) 30 MG capsule Take 30 mg by mouth daily.    [provider]     Allergies Erythromycin and Tetracyclines & related  Family History  Problem Relation Age of Onset  . Stroke Maternal Grandfather   . Hypertension Maternal Grandfather     Social History Social History   Tobacco Use  . Smoking status: Never Smoker  . Smokeless tobacco: Never Used  Substance Use Topics  . Alcohol use: No  . Drug use: No    Review of Systems  Constitutional: No fever/chills Eyes: No visual changes.  ENT: No sore throat.  Cardiovascular: Denies chest pain. Respiratory: Denies shortness of breath. Gastrointestinal: As above Genitourinary: As above Musculoskeletal: Negative for back pain. Skin: Negative for rash. Neurological: Negative for headaches or weakness   ____________________________________________   PHYSICAL EXAM:  VITAL SIGNS: ED Triage Vitals  Enc Vitals Group     BP 04/26/19 1201 137/78     Pulse Rate 04/26/19 1201 90     Resp 04/26/19 1201 20     Temp 04/26/19 1201 98 F (36.7 C)     Temp Source 04/26/19 1201 Oral     SpO2 04/26/19 1201 100 %     Weight 04/26/19 1039 51.3 kg (113 lb)     Height 04/26/19 1039 1.6 m (5\' 3" )     Head Circumference --      Peak Flow --      Pain Score 04/26/19 1039 0     Pain Loc --      Pain Edu? --      Excl. in [place]? --     Constitutional: Alert and oriented.  Nose: No congestion/rhinnorhea. Mouth/Throat: Mucous membranes are moist.    Cardiovascular: Normal rate, regular rhythm.  Good peripheral circulation. Respiratory: Normal respiratory effort.  No retractions.  Gastrointestinal: Soft and nontender. No distention.  Reassuring exam Genitourinary: deferred Musculoskeletal: No lower extremity tenderness nor edema.  Warm and well perfused Neurologic:  Normal speech and language. No gross focal neurologic deficits are appreciated.  Skin:  Skin is warm, dry and intact. No rash noted. Psychiatric:  Mood and affect are normal. Speech and behavior are normal.  ____________________________________________   LABS (all labs ordered are listed, but only abnormal results are displayed)  Labs Reviewed  COMPREHENSIVE METABOLIC PANEL - Abnormal; Notable for the following components:      Result Value   Glucose, Bld 101 (*)    Total Protein 9.0 (*)    All other components within normal limits  HCG, QUANTITATIVE, PREGNANCY - Abnormal; Notable for the following components:   hCG, Beta Chain, Quant, S 20,065 (*)    All other components within  normal limits  CBC - Abnormal; Notable for the following components:   MCH 25.6 (*)    All other components within normal limits  ABO/RH   ____________________________________________  EKG  None ____________________________________________  RADIOLOGY  Ultrasound ____________________________________________   PROCEDURES  Procedure(s) performed: No  Procedures   Critical Care performed: No ____________________________________________   INITIAL IMPRESSION / ASSESSMENT AND PLAN / ED COURSE  Pertinent labs & imaging results that were available during my care of the patient were reviewed by me and considered in my medical decision making (see chart for details).  Patient p/w vaginal bleeding/spotting as above, reassuring abd exam. Diff includes subchorionic hem, miscarriage, ectopic.  Pending hCG, ultrasound  Ultrasound demonstrates twin gestational sacs, approximately 4-1/2 to 5 weeks, repeat ultrasound in 14 days recommended.  Beta has increased, Rh+.  Close follow-up with OB      ____________________________________________   FINAL CLINICAL IMPRESSION(S) / ED DIAGNOSES  Final diagnoses:  Vaginal bleeding in pregnancy        Note:  This document was prepared using Dragon voice recognition software and may include unintentional dictation errors.   Jene Every, MD 04/26/19 1606

## 2019-04-26 NOTE — ED Notes (Signed)
Went to ultra sound

## 2019-04-26 NOTE — ED Triage Notes (Signed)
Pt reports [place] about [redacted] weeks pregnant and had some vaginal bleeding with clots and cramping this weekend so she called them and they advised her to come here.

## 2019-05-10 NOTE — H&P (Signed)
MISSED ABORTION  Traci Cooper is a 38 y.o. female G1P0 with an LMP of 03/16/2019, which would put her at [redacted]w[redacted]d by LMP, who is here for Follow-up (ER follow up, u/s follow up vaginal bleeding early pregnancy)  Pt story: Patient is following up after an ER visit on 04/26/2019. Patient presented to the ER with vaginal bleeding in pregnancy and mild LLQ cramping. Her vaginal bleeding had some blood clots but was now spotting. No fever, chills, or dysuria.  Per ED note, US OB demonstrated twin gestational sacs, approximately 4-1/2 to 5 weeks. -Repeat US in 14 days was recommended, as well as close follow-up with OB. Beta has increased, Rh+.    Imaging: 04/26/2019 US OB:  twin gestational sacs, approximately 4-1/2 to 5 weeks.  05/10/2019: Single iup seen No yolk sac or fetal pole seen Gest sac=1.20 cm= 6.0 wks bil ovs wnl   Labs: Blood type: AB Positive  Beta quant:  04/20/19: 9,892 04/26/2019: 20,065   Counseling:  Patient present with her partner. Discussed that her pregnancy is not viable, and patient expressed understanding.     Exam:   BP 127/78   Ht 160 cm (5\' 3" )   Wt 52.3 kg (115 lb 3.2 oz)   LMP 03/16/2019 (Exact Date)   BMI 20.41 kg/m   Constitutional:  General appearance: Well nourished, well developed female in no acute distress.  Neuro/psych:  Normal mood and affect. No gross motor deficits. Neck:  Supple, normal appearance.  Respiratory:  Normal respiratory effort, no use of accessory muscles Skin:  No visible rashes or external lesions  Impression:   The encounter diagnosis was Missed abortion.   Plan:   Spontaneous miscarriage: Causes of spontaneous miscarriage discussed with patient, including prevalence, common causes, and the expectation that this event does not increase the chance that she will miscarry again in the future. Emotional support given.  She is Rh positive, and therefore does not need Rhogam  Management  options discussed, including: Expectant, medical and surgical.  Surgical management: Because of her age she is interested in genetic testing. Will send off chromosomes for genetic analysis with [facility].  Risks of surgery were discussed with the patient including but not limited to: bleeding which may require transfusion; infection which may require antibiotics; injury to uterus or surrounding organs; intrauterine scarring which may impair future fertility; need for additional procedures including laparotomy or laparoscopy; and other postoperative/anesthesia complications.   -Patient will investigate cost of surgery with her insurance. -Patient will take time to talk to a few family members and think about it and then let us know her decision. -Handouts given    Return for Postop check.

## 2019-05-11 ENCOUNTER — Encounter
Admission: RE | Admit: 2019-05-11 | Discharge: 2019-05-11 | Disposition: A | Discharge status: Home / Self Care | Payer: Commercial Managed Care - PPO | Source: Ambulatory Visit | Attending: Obstetrics and Gynecology | Admitting: Obstetrics and Gynecology

## 2019-05-11 ENCOUNTER — Other Ambulatory Visit: Payer: Self-pay

## 2019-05-11 DIAGNOSIS — Z20828 Contact with and (suspected) exposure to other viral communicable diseases: Secondary | ICD-10-CM | POA: Insufficient documentation

## 2019-05-11 DIAGNOSIS — Z01812 Encounter for preprocedural laboratory examination: Secondary | ICD-10-CM | POA: Diagnosis present

## 2019-05-11 HISTORY — DX: Other complications of anesthesia, initial encounter: T88.59XA

## 2019-05-11 HISTORY — DX: Personal history of urinary calculi: Z87.442

## 2019-05-11 HISTORY — DX: Anxiety disorder, unspecified: F41.9

## 2019-05-11 HISTORY — DX: Pneumonia, unspecified organism: J18.9

## 2019-05-11 LAB — TYPE AND SCREEN
ABO/RH(D): AB POS
Antibody Screen: NEGATIVE
Extend sample reason: UNDETERMINED

## 2019-05-11 NOTE — Patient Instructions (Addendum)
Your procedure is scheduled on: Thursday 05/13/19 Report to DAY SURGERY DEPARTMENT LOCATED ON 2ND FLOOR MEDICAL MALL ENTRANCE. To find out your arrival time please call [phone] between 1PM - 3PM on Wednesday 05/22/19.  Remember: Instructions that are not followed completely may result in serious medical risk, up to and including death, or upon the discretion of your surgeon and anesthesiologist your surgery may need to be rescheduled.     _X__ 1. Do not eat food after midnight the night before your procedure.                 No gum chewing or hard candies. You may drink clear liquids up to 2 hours                 before you are scheduled to arrive for your surgery- DO not drink clear                 liquids within 2 hours of the start of your surgery.                 Clear Liquids include:  water, apple juice without pulp, clear carbohydrate                 drink such as Clearfast or Gatorade, Black Coffee or Tea (Do not add                 anything to coffee or tea). Diabetics water only  __X__2.  On the morning of surgery brush your teeth with toothpaste and water, you                 may rinse your mouth with mouthwash if you wish.  Do not swallow any              toothpaste of mouthwash.     _X__ 3.  No Alcohol for 24 hours before or after surgery.   _X__ 4.  Do Not Smoke or use e-cigarettes For 24 Hours Prior to Your Surgery.                 Do not use any chewable tobacco products for at least 6 hours prior to                 surgery.  ____  5.  Bring all medications with you on the day of surgery if instructed.   __X__  6.  Notify your doctor if there is any change in your medical condition      (cold, fever, infections).     Do not wear jewelry, make-up, hairpins, clips or nail polish. Do not wear lotions, powders, or perfumes.  Do not shave 48 hours prior to surgery. Men may shave face and neck. Do not bring valuables to the hospital.    [facility] is not responsible  for any belongings or valuables.  Contacts, dentures/partials or body piercings may not be worn into surgery. Bring a case for your contacts, glasses or hearing aids, a denture cup will be supplied. Leave your suitcase in the car. After surgery it may be brought to your room. For patients admitted to the hospital, discharge time is determined by your treatment team.   Patients discharged the day of surgery will not be allowed to drive home.   Please read over the following fact sheets that you were given:   MRSA Information  __X__ Take these medicines the morning of surgery with A SIP OF WATER:  1. nne  2.   3.   4.  5.  6.  ____ Fleet Enema (as directed)   ____ Use CHG Soap/SAGE wipes as directed  ____ Use inhalers on the day of surgery  ____ Stop metformin/Janumet/Farxiga 2 days prior to surgery    ____ Take 1/2 of usual insulin dose the night before surgery. No insulin the morning          of surgery.   ____ Stop Blood Thinners Coumadin/Plavix/Xarelto/Pleta/Pradaxa/Eliquis/Effient/Aspirin  on   Or contact your Surgeon, Cardiologist or Medical Doctor regarding  ability to stop your blood thinners  __X__ Stop Anti-inflammatories 7 days before surgery such as Advil, Ibuprofen, Motrin,  BC or Goodies Powder, Naprosyn, Naproxen, Aleve, Aspirin    __X__ Stop all herbal supplements, fish oil or vitamin E until after surgery.    ____ Bring C-Pap to the hospital.

## 2019-05-12 LAB — SARS CORONAVIRUS 2 (TAT 6-24 HRS): SARS Coronavirus 2: NEGATIVE

## 2019-05-13 ENCOUNTER — Ambulatory Visit: Payer: Commercial Managed Care - PPO | Admitting: Anesthesiology

## 2019-05-13 ENCOUNTER — Ambulatory Visit
Admission: RE | Admit: 2019-05-13 | Discharge: 2019-05-13 | Disposition: A | Discharge status: Home / Self Care | Payer: Commercial Managed Care - PPO | Attending: Obstetrics and Gynecology | Admitting: Obstetrics and Gynecology

## 2019-05-13 ENCOUNTER — Other Ambulatory Visit: Payer: Self-pay

## 2019-05-13 ENCOUNTER — Encounter
Admission: RE | Disposition: A | Discharge status: Home / Self Care | Payer: Self-pay | Source: Home / Self Care | Attending: Obstetrics and Gynecology

## 2019-05-13 DIAGNOSIS — O021 Missed abortion: Secondary | ICD-10-CM | POA: Insufficient documentation

## 2019-05-13 DIAGNOSIS — F329 Major depressive disorder, single episode, unspecified: Secondary | ICD-10-CM | POA: Insufficient documentation

## 2019-05-13 DIAGNOSIS — F419 Anxiety disorder, unspecified: Secondary | ICD-10-CM | POA: Diagnosis not present

## 2019-05-13 DIAGNOSIS — Z3A01 Less than 8 weeks gestation of pregnancy: Secondary | ICD-10-CM | POA: Insufficient documentation

## 2019-05-13 DIAGNOSIS — K219 Gastro-esophageal reflux disease without esophagitis: Secondary | ICD-10-CM | POA: Diagnosis not present

## 2019-05-13 HISTORY — PX: DILATION AND EVACUATION: SHX1459

## 2019-05-13 SURGERY — DILATION AND EVACUATION, UTERUS
Anesthesia: General

## 2019-05-13 MED ORDER — ONDANSETRON HCL 4 MG/2ML IJ SOLN: 4.0000 mg | Freq: Once | INTRAMUSCULAR | Status: DC | PRN

## 2019-05-13 MED ORDER — ROCURONIUM BROMIDE 100 MG/10ML IV SOLN
INTRAVENOUS | Status: DC | PRN
  Administered 2019-05-13: 20 mg via INTRAVENOUS
  Administered 2019-05-13: 5 mg via INTRAVENOUS

## 2019-05-13 MED ORDER — DOCUSATE SODIUM 100 MG PO CAPS: 100.0000 mg | ORAL_CAPSULE | Freq: Two times a day (BID) | ORAL | 0 refills | Status: DC

## 2019-05-13 MED ORDER — SUGAMMADEX SODIUM 200 MG/2ML IV SOLN
INTRAVENOUS | Status: DC | PRN
  Administered 2019-05-13: 300 mg via INTRAVENOUS

## 2019-05-13 MED ORDER — FENTANYL CITRATE (PF) 100 MCG/2ML IJ SOLN: 25.0000 ug | INTRAMUSCULAR | Status: DC | PRN

## 2019-05-13 MED ORDER — ONDANSETRON HCL 4 MG/2ML IJ SOLN
INTRAMUSCULAR | Status: DC | PRN
  Administered 2019-05-13: 4 mg via INTRAVENOUS

## 2019-05-13 MED ORDER — GABAPENTIN 300 MG PO CAPS
ORAL_CAPSULE | ORAL | Status: AC
  Administered 2019-05-13: 300 mg via ORAL
  Filled 2019-05-13: qty 1

## 2019-05-13 MED ORDER — ACETAMINOPHEN 500 MG PO TABS
1000.0000 mg | ORAL_TABLET | ORAL | Status: AC
  Administered 2019-05-13: 09:00:00 1000 mg via ORAL

## 2019-05-13 MED ORDER — MIDAZOLAM HCL 2 MG/2ML IJ SOLN
INTRAMUSCULAR | Status: DC | PRN
  Administered 2019-05-13: 2 mg via INTRAVENOUS

## 2019-05-13 MED ORDER — FAMOTIDINE 20 MG PO TABS
20.0000 mg | ORAL_TABLET | Freq: Once | ORAL | Status: AC
  Administered 2019-05-13: 09:00:00 20 mg via ORAL

## 2019-05-13 MED ORDER — LACTATED RINGERS IV SOLN
INTRAVENOUS | Status: DC | PRN
  Administered 2019-05-13: 09:00:00 via INTRAVENOUS

## 2019-05-13 MED ORDER — PROPOFOL 10 MG/ML IV BOLUS
INTRAVENOUS | Status: DC | PRN
  Administered 2019-05-13: 10 mg via INTRAVENOUS
  Administered 2019-05-13: 100 mg via INTRAVENOUS

## 2019-05-13 MED ORDER — ACETAMINOPHEN 500 MG PO TABS: 1000.0000 mg | ORAL_TABLET | Freq: Three times a day (TID) | ORAL | 0 refills | Status: AC | PRN

## 2019-05-13 MED ORDER — FENTANYL CITRATE (PF) 100 MCG/2ML IJ SOLN
INTRAMUSCULAR | Status: AC
  Filled 2019-05-13: qty 2

## 2019-05-13 MED ORDER — MIDAZOLAM HCL 2 MG/2ML IJ SOLN
INTRAMUSCULAR | Status: AC
  Filled 2019-05-13: qty 2

## 2019-05-13 MED ORDER — LIDOCAINE HCL (CARDIAC) PF 100 MG/5ML IV SOSY
PREFILLED_SYRINGE | INTRAVENOUS | Status: DC | PRN
  Administered 2019-05-13: 60 mg via INTRAVENOUS

## 2019-05-13 MED ORDER — SUCCINYLCHOLINE CHLORIDE 20 MG/ML IJ SOLN
INTRAMUSCULAR | Status: DC | PRN
  Administered 2019-05-13: 80 mg via INTRAVENOUS

## 2019-05-13 MED ORDER — PHENYLEPHRINE HCL (PRESSORS) 10 MG/ML IV SOLN
INTRAVENOUS | Status: DC | PRN
  Administered 2019-05-13: 100 ug via INTRAVENOUS

## 2019-05-13 MED ORDER — FENTANYL CITRATE (PF) 100 MCG/2ML IJ SOLN
INTRAMUSCULAR | Status: DC | PRN
  Administered 2019-05-13: 50 ug via INTRAVENOUS

## 2019-05-13 MED ORDER — FAMOTIDINE 20 MG PO TABS
ORAL_TABLET | ORAL | Status: AC
  Administered 2019-05-13: 20 mg via ORAL
  Filled 2019-05-13: qty 1

## 2019-05-13 MED ORDER — DEXMEDETOMIDINE HCL 200 MCG/2ML IV SOLN
INTRAVENOUS | Status: DC | PRN
  Administered 2019-05-13: 12 ug via INTRAVENOUS

## 2019-05-13 MED ORDER — DEXAMETHASONE SODIUM PHOSPHATE 10 MG/ML IJ SOLN
INTRAMUSCULAR | Status: DC | PRN
  Administered 2019-05-13: 10 mg via INTRAVENOUS

## 2019-05-13 MED ORDER — LACTATED RINGERS IV SOLN: INTRAVENOUS | Status: DC

## 2019-05-13 MED ORDER — GABAPENTIN 300 MG PO CAPS
300.0000 mg | ORAL_CAPSULE | ORAL | Status: AC
  Administered 2019-05-13: 09:00:00 300 mg via ORAL

## 2019-05-13 MED ORDER — ACETAMINOPHEN 500 MG PO TABS
ORAL_TABLET | ORAL | Status: AC
  Administered 2019-05-13: 1000 mg via ORAL
  Filled 2019-05-13: qty 2

## 2019-05-13 MED ORDER — IBUPROFEN 800 MG PO TABS: 800.0000 mg | ORAL_TABLET | Freq: Three times a day (TID) | ORAL | 1 refills | Status: AC | PRN

## 2019-05-13 SURGICAL SUPPLY — 21 items
CATH ROBINSON RED A/P 16FR (CATHETERS) ×3 IMPLANT
COVER WAND RF STERILE (DRAPES) ×3 IMPLANT
FILTER UTR ASPR SPEC (MISCELLANEOUS) ×1 IMPLANT
FLTR UTR ASPR SPEC (MISCELLANEOUS) ×3
GLOVE BIO SURGEON STRL SZ7 (GLOVE) ×3 IMPLANT
GLOVE INDICATOR 7.5 STRL GRN (GLOVE) ×3 IMPLANT
GOWN STRL REUS W/ TWL LRG LVL3 (GOWN DISPOSABLE) ×2 IMPLANT
GOWN STRL REUS W/TWL LRG LVL3 (GOWN DISPOSABLE) ×4
KIT BERKELEY 1ST TRIMESTER 3/8 (MISCELLANEOUS) ×3 IMPLANT
KIT TURNOVER CYSTO (KITS) ×3 IMPLANT
PACK DNC HYST (MISCELLANEOUS) ×3 IMPLANT
PAD OB MATERNITY 4.3X12.25 (PERSONAL CARE ITEMS) ×3 IMPLANT
PAD PREP 24X41 OB/GYN DISP (PERSONAL CARE ITEMS) ×3 IMPLANT
SET BERKELEY SUCTION TUBING (SUCTIONS) ×3 IMPLANT
TOWEL OR 17X26 4PK STRL BLUE (TOWEL DISPOSABLE) ×3 IMPLANT
VACURETTE 10 RIGID CVD (CANNULA) IMPLANT
VACURETTE 6 ASPIR F TIP BERK (CANNULA) IMPLANT
VACURETTE 7MM F TIP (CANNULA) ×2
VACURETTE 7MM F TIP STRL (CANNULA) ×1 IMPLANT
VACURETTE 8 RIGID CVD (CANNULA) IMPLANT
VACURETTE 8MM F TIP (MISCELLANEOUS) ×3 IMPLANT

## 2019-05-13 NOTE — Anesthesia Procedure Notes (Signed)
Procedure Name: Intubation Date/Time: 05/13/2019 9:32 AM Performed by: Justus Memory, CRNA Pre-anesthesia Checklist: Patient identified, Patient being monitored, Timeout performed, Emergency Drugs available and Suction available Patient Re-evaluated:Patient Re-evaluated prior to induction Oxygen Delivery Method: Circle system utilized Preoxygenation: Pre-oxygenation with 100% oxygen Induction Type: IV induction Ventilation: Mask ventilation without difficulty Laryngoscope Size: Mac, 3 and McGraph Grade View: Grade I Tube type: Oral Tube size: 7.0 mm Number of attempts: 1 Airway Equipment and Method: Stylet and Video-laryngoscopy Placement Confirmation: ETT inserted through vocal cords under direct vision,  positive ETCO2 and breath sounds checked- equal and bilateral Secured at: 21 cm Tube secured with: Tape Dental Injury: Teeth and Oropharynx as per pre-operative assessment

## 2019-05-13 NOTE — Anesthesia Post-op Follow-up Note (Signed)
Anesthesia QCDR form completed.        

## 2019-05-13 NOTE — Op Note (Signed)
Operative Report Suction Dilation and Curettage   Indications: Missed abortion   Pre-operative Diagnosis: Missed abortion at 6 weeks  Post-operative Diagnosis: same.  Procedure: 1. Suction D&C 2. Collection of [place] POC  Surgeon: [name], MD  Assistant(s):  None  Anesthesia: General LMA anesthesia  Anesthesiologist: Alvin Critchley, MD Anesthesiologist: Alvin Critchley, MD CRNA: Justus Memory, CRNA  Estimated Blood Loss:  78m         Intraoperative medications: N/A         Total IV Fluids: see anesthesia notes  Urine Output: 593m        Specimens: products of conception; SNP microarray POC kit         Complications:  None; patient tolerated the procedure well.         Disposition: PACU - hemodynamically stable.         Condition: stable  Findings: Uterus measuring 6 weeks retroverted; normal cervix, vagina, perineum.   Indication for procedure/Consents: [age].o. G1P0 at about 6 weeks  here for scheduled surgery for the aforementioned diagnoses.   Risks of surgery were discussed with the patient including but not limited to: bleeding which may require transfusion; infection which may require antibiotics; injury to uterus or surrounding organs; intrauterine scarring which may impair future fertility; need for additional procedures including laparotomy or laparoscopy; and other postoperative/anesthesia complications. Written informed consent was obtained.    Procedure Details:   The patient was consented in preop and all her questions answered. She was then taken to the operating room where general anesthesia was administered and was found to be adequate.  After a formal and adequate timeout was performed, she was placed in the dorsal lithotomy position and examined with the above findings. She was then prepped and draped in the sterile manner.   Her bladder was catheterized for an estimated amount of clear, yellow urine. A speculum was then placed in the patient's  vagina and a single tooth tenaculum was applied to the anterior lip of the cervix.    No uterine sounding was performed on this pregnant uterus. Her cervix was serially dilated to accommodate a 7 sized flexible suction curette.  A gentle sharp curettage was then performed until there was a gritty texture in all four quadrants.  The tenaculum was removed from the anterior lip of the cervix and the vaginal speculum was removed after noting good hemostasis. The patient tolerated the procedure well and was taken to the recovery area awake, extubated and in stable condition.  The patient will be discharged to home as per PACU criteria.  She will receive another dose of oral antibiotics prior to discharge. Routine postoperative instructions given.  She was prescribed Percocet, Ibuprofen and Colace.  She will follow up in the clinic in two weeks for postoperative evaluation.

## 2019-05-13 NOTE — Discharge Instructions (Signed)

## 2019-05-13 NOTE — Transfer of Care (Signed)
Immediate Anesthesia Transfer of Care Note  Patient: Traci Cooper  Procedure(s) Performed: DILATATION AND EVACUATION (N/A )  Patient Location: PACU  Anesthesia Type:General  Level of Consciousness: sedated  Airway & Oxygen Therapy: Patient Spontanous Breathing and Patient connected to face mask oxygen  Post-op Assessment: Report given to RN and Post -op Vital signs reviewed and stable  Post vital signs: Reviewed and stable  Last Vitals:  Vitals Value Taken Time  BP 125/86 05/13/19 1014  Temp    Pulse 86 05/13/19 1016  Resp 11 05/13/19 1016  SpO2 100 % 05/13/19 1016  Vitals shown include unvalidated device data.  Last Pain:  Vitals:   05/13/19 0829  TempSrc: Tympanic  PainSc: 2          Complications: No apparent anesthesia complications

## 2019-05-13 NOTE — Interval H&P Note (Signed)
History and Physical Interval Note:  05/13/2019 8:57 AM  Traci Cooper  has presented today for surgery, with the diagnosis of missed abortion.  The various methods of treatment have been discussed with the patient and family. After consideration of risks, benefits and other options for treatment, the patient has consented to  Procedure(s): DILATATION AND EVACUATION (N/A) as a surgical intervention.  We will send off [place] POC SNP microarray chromosome analysis. The patient's history has been reviewed, patient examined, no change in status, stable for surgery.  I have reviewed the patient's chart and labs.  Questions were answered to the patient's satisfaction.     [name]

## 2019-05-13 NOTE — Anesthesia Preprocedure Evaluation (Addendum)
Anesthesia Evaluation  Patient identified by MRN, date of birth, ID band Patient awake    Reviewed: Allergy & Precautions, NPO status , Patient's Chart, lab work & pertinent test results  History of Anesthesia Complications (+) AWARENESS UNDER ANESTHESIA and history of anesthetic complications  Airway Mallampati: II  TM Distance: >3 FB     Dental  (+) Teeth Intact   Pulmonary    Pulmonary exam normal        Cardiovascular negative cardio ROS Normal cardiovascular exam     Neuro/Psych PSYCHIATRIC DISORDERS Anxiety Depression negative neurological ROS     GI/Hepatic Neg liver ROS, GERD  Controlled,  Endo/Other  negative endocrine ROS  Renal/GU negative Renal ROS  negative genitourinary   Musculoskeletal negative musculoskeletal ROS (+)   Abdominal Normal abdominal exam  (+)   Peds negative pediatric ROS (+)  Hematology negative hematology ROS (+)   Anesthesia Other Findings   Reproductive/Obstetrics                             Anesthesia Physical Anesthesia Plan  ASA: II  Anesthesia Plan: General   Post-op Pain Management:    Induction: Intravenous  PONV Risk Score and Plan:   Airway Management Planned: Oral ETT  Additional Equipment:   Intra-op Plan:   Post-operative Plan: Extubation in OR  Informed Consent: I have reviewed the patients History and Physical, chart, labs and discussed the procedure including the risks, benefits and alternatives for the proposed anesthesia with the patient or authorized representative who has indicated his/her understanding and acceptance.     Dental advisory given  Plan Discussed with: CRNA and Surgeon  Anesthesia Plan Comments:        Anesthesia Quick Evaluation

## 2019-05-14 LAB — SURGICAL PATHOLOGY

## 2019-05-18 NOTE — Anesthesia Postprocedure Evaluation (Signed)
Anesthesia Post Note  Patient: Traci Cooper  Procedure(s) Performed: DILATATION AND EVACUATION (N/A )  Patient location during evaluation: PACU Anesthesia Type: General Level of consciousness: awake and alert and oriented Pain management: pain level controlled Vital Signs Assessment: post-procedure vital signs reviewed and stable Respiratory status: spontaneous breathing Cardiovascular status: blood pressure returned to baseline Anesthetic complications: no     Last Vitals:  Vitals:   05/13/19 1109 05/13/19 1204  BP: 121/66 116/80  Pulse: 79 81  Resp: 16   Temp: (!) 36.2 C   SpO2: 100% 100%    Last Pain:  Vitals:   05/13/19 1109  TempSrc: Temporal  PainSc: 0-No pain                 Jamare Vanatta

## 2019-06-25 ENCOUNTER — Other Ambulatory Visit: Payer: Self-pay | Admitting: Primary Care

## 2019-06-25 DIAGNOSIS — F411 Generalized anxiety disorder: Secondary | ICD-10-CM

## 2019-07-22 ENCOUNTER — Ambulatory Visit
Admission: RE | Admit: 2019-07-22 | Discharge: 2019-07-22 | Disposition: A | Discharge status: Home / Self Care | Payer: Commercial Managed Care - PPO | Source: Ambulatory Visit | Attending: Maternal & Fetal Medicine | Admitting: Maternal & Fetal Medicine

## 2019-07-22 ENCOUNTER — Other Ambulatory Visit: Payer: Self-pay

## 2019-07-22 DIAGNOSIS — Z8279 Family history of other congenital malformations, deformations and chromosomal abnormalities: Secondary | ICD-10-CM | POA: Insufficient documentation

## 2019-07-22 NOTE — Progress Notes (Addendum)
Virtual Visit via Telephone Note  I connected with Traci Cooper on July 22, 2019 at  9:00 AM EST by telephone and verified that I am speaking with the correct person using two identifiers.  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 We also reviewed screening and testing options which would be available in any future pregnancy due to this history as well as her advanced maternal age. This note summarizes the information we discussed.  We reviewed that chromosomes are the inherited structures that contain our instructions for development (genes). Each cell of our body normally has 46 chromosomes, matched up into 23 pairs. The last pair determines our gender and are called the sex chromosomes. A female has an X and a Y chromosome, while a female has two X chromosomes. Rarely, when a mother's egg and father's sperm unite, an extra or missing chromosome can be passed on to the baby by mistake. Changes in the number or the structure of the chromosomes may result in a child with some degree of mental retardation and physical problems.   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  No other copy number differences were noted on this report.  Additional samples were also evaluated with the testing because there was note of a possible twin gestation on early ultrasound.  All dissections were identical, indicating that this sample was from one fetus or from identical twins. The report also states parent of origin, if the patient would like this information, we are  happy to disclose this, though it would not be expected to change the counseling in the absence of a familial translocation.  Trisomy 22 is one of the most common chromosome differences seen in first trimester pregnancy losses.  It most often occurs due to nondisjunction, or abnormal separation, of the chromosomes during production of the egg or sperm.  Trisomy may also occur as the result of a chromosome translocation in a parent, though this is much less common.  The microarray results did not show any other imbalance to suggest involvement of other chromosomes which would indicate a higher chance for a translocation and there is no family history of recurrent pregnancy loss or anomalies.  In the absence of a chromosome translocation, the recurrence for any aneuploidy for this couple is estimated to be 1% or the maternal age related risk.  Because [name] will be at least 39 years old at the time of any future pregnancy, we would expect her risks to be the same as her age related chance.  Given the maternal age, we discussed the following prenatal screening and testing options for any future pregnancy:  The most accurate screening option for chromosome conditions is cell free fetal DNA testing.  This test utilizes a maternal blood sample and DNA sequencing technology to isolate circulating cell free fetal DNA from maternal plasma.  The fetal DNA can then be analyzed for DNA sequences that are derived from the three most common chromosomes involved in aneuploidy, chromosomes 13, 18, and 21.  If the overall amount of DNA is greater than the expected level for any of these  chromosomes, aneuploidy is suspected.  The detection rates are >99% for Down syndrome, >98% for trisomy 18 and >91% for Trisomy 13.  While we do not consider it a replacement for invasive testing and karyotype analysis, a negative result from this testing would be reassuring, though not a guarantee of a normal chromosome complement for  the baby.  An abnormal result may be suggestive of an abnormal chromosome complement, though we would still recommend CVS or amniocentesis to confirm any findings from this testing.  First trimester screening, which includes nuchal translucency ultrasound screen and first trimester maternal serum marker screening, is the test that has most recently been available for low risk patients.  The nuchal translucency has approximately an 80% detection rate for Down syndrome and can be positive for other chromosome abnormalities as well as congenital heart defects.  When combined with a maternal serum marker screening, the detection rate is up to 90% for Down syndrome and up to 97% for trisomy 18.   Given current recommendations during COVID, we are offering only the biochemical testing portion of this testing (without the ultrasound and NT portion), which has a much lower detection rate.  Maternal serum marker screening, or "quad" screen, is a blood test that measures pregnancy proteins, can provide risk assessments for Down syndrome, trisomy 18, and open neural tube defects (spina bifida, anencephaly). Because it does not directly examine the fetus, it cannot positively diagnose or rule out these problems. This is a second trimester option which could be offered along with the anatomy ultrasound. It can detect approximately 75% of babies with Down syndrome, 80% of babies with open spina bifida and 70% of babies with trisomy 32.  Targeted ultrasound uses high frequency sound waves to create an image of the developing fetus.  An ultrasound is often recommended as a routine means of evaluating the pregnancy.  It is also used to screen for fetal anatomy problems (for example, a heart defect) that might be suggestive of a chromosomal or other abnormality. We are currently not recommending a first trimester ultrasound other than that which would be ordered for dating and viability.  The chorionic villus sampling  procedure is available for first trimester chromosome analysis.  This involves the withdrawal of a small amount of chorionic villi (tissue from the developing placenta).  Risk of pregnancy loss is estimated to be approximately 1 in 200 to 1 in 100 (0.5 to 1%).  There is approximately a 1% (1 in 100) chance that the CVS chromosome results will be unclear.  Chorionic villi cannot be tested for neural tube defects.     Amniocentesis involves the removal of a small amount of amniotic fluid from the sac surrounding the fetus with the use of a thin needle inserted through the maternal abdomen and uterus.  Ultrasound guidance is used throughout the procedure.  Fetal cells from amniotic fluid are directly evaluated and > 99.5% of chromosome problems and > 98% of open neural tube defects can be detected. This procedure is generally performed after the 15th week of pregnancy.  The main risks to this procedure include complications leading to miscarriage in less than 1 in 200 cases (0.5%).  Cystic Fibrosis and Spinal Muscular Atrophy (SMA) screening were also discussed with the patient. Both conditions are recessive, which means that both parents must be carriers in order to have a child with the disease.  Cystic fibrosis (CF) is one of the most common genetic conditions in persons of Caucasian ancestry.  This condition  occurs in approximately 1 in 2,500 Caucasian persons and results in thickened secretions in the lungs, digestive, and reproductive systems.  For a baby to be at risk for having CF, both of the parents must be carriers for this condition.  Approximately 1 in 2 Caucasian persons is a carrier for CF.  Current carrier testing looks for the most common mutations in the gene for CF and can detect approximately 90% of carriers in the Caucasian population.  This means that the carrier screening can greatly reduce, but cannot eliminate, the chance for an individual to have a child with CF.  If an individual is  found to be a carrier for CF, then carrier testing would be available for the partner. As part of [place]'s newborn screening profile, all babies born in the state of [place] will have a two-tier screening process.  Specimens are first tested to determine the concentration of immunoreactive trypsinogen (IRT).  The top 5% of specimens with the highest IRT values then undergo DNA testing using a panel of over 40 common CF mutations. SMA is a neurodegenerative disorder that leads to atrophy of skeletal muscle and overall weakness.  This condition is also more prevalent in the Caucasian population, with 1 in 40-1 in 60 persons being a carrier and 1 in 6,000-1 in 10,000 children being affected.  There are multiple forms of the disease, with some causing death in infancy to other forms with survival into adulthood.  The genetics of SMA is complex, but carrier screening can detect up to 95% of carriers in the Caucasian population.  Similar to CF, a negative result can greatly reduce, but cannot eliminate, the chance to have a child with SMA. The patient declined carrier screening for CF and SMA.  We talked about the option of signing up for Early Check to have the baby tested for SMA after delivery as part of a new study in [place].  This registration can be done online prior to delivery if desired.  We obtained a detailed family history and pregnancy history.  Ms. Langbehn reported that her mother had heart surgery in adulthood due to a valve problem.  We would need additional information as to the type of problem in order to determine any risks to other family members.  Jill Alexanders, the father of the baby, reported an uncle and grandfather with heart disease and heart attacks, which we reviewed are likely multifactorial, or due to inherited as well as lifestyle factors. The remainder of the family history was reported to be unremarkable for birth defects, intellectual delays, recurrent pregnancy loss or known  chromosome abnormalities.  The couple indicated that they would reach out in any upcoming pregnancy at which time we can plan for the desired testing at the appropriate gestational age.  We may be reached at [phone].  Cherly Anderson, MS, CGC  I provided 50 minutes of non-face-to-face time during this encounter.   Traci Cooper

## 2019-08-08 ENCOUNTER — Other Ambulatory Visit: Payer: Self-pay | Admitting: Primary Care

## 2019-08-08 DIAGNOSIS — F411 Generalized anxiety disorder: Secondary | ICD-10-CM

## 2019-08-09 NOTE — Telephone Encounter (Signed)
I believe there is a MyChart message this but the dosage change, right?

## 2019-08-09 NOTE — Telephone Encounter (Signed)
Yes, based on our last visit we were at the 30 mg dose.  I haven't heard a response from her yet regarding which dose she is requesting.  If she wants to increase the dose to 60 mg then I need to know why. Thanks.

## 2019-08-12 MED ORDER — DULOXETINE HCL 30 MG PO CPEP: 30.0000 mg | ORAL_CAPSULE | Freq: Every evening | ORAL | 1 refills | Status: DC

## 2019-08-13 NOTE — Telephone Encounter (Signed)
Noted. I'll respond to patient.

## 2019-10-10 ENCOUNTER — Ambulatory Visit
Admission: EM | Admit: 2019-10-10 | Discharge: 2019-10-10 | Disposition: A | Discharge status: Home / Self Care | Payer: Commercial Managed Care - PPO | Attending: Family Medicine | Admitting: Family Medicine

## 2019-10-10 ENCOUNTER — Ambulatory Visit (INDEPENDENT_AMBULATORY_CARE_PROVIDER_SITE_OTHER): Payer: Commercial Managed Care - PPO

## 2019-10-10 ENCOUNTER — Encounter: Payer: Self-pay | Admitting: Emergency Medicine

## 2019-10-10 ENCOUNTER — Other Ambulatory Visit: Payer: Self-pay

## 2019-10-10 DIAGNOSIS — N132 Hydronephrosis with renal and ureteral calculous obstruction: Secondary | ICD-10-CM | POA: Insufficient documentation

## 2019-10-10 DIAGNOSIS — R112 Nausea with vomiting, unspecified: Secondary | ICD-10-CM | POA: Diagnosis present

## 2019-10-10 DIAGNOSIS — N200 Calculus of kidney: Secondary | ICD-10-CM | POA: Diagnosis not present

## 2019-10-10 DIAGNOSIS — R109 Unspecified abdominal pain: Secondary | ICD-10-CM | POA: Diagnosis not present

## 2019-10-10 LAB — CBC
HCT: 39.3 % (ref 36.0–46.0)
Hemoglobin: 12.4 g/dL (ref 12.0–15.0)
MCH: 25.4 pg — ABNORMAL LOW (ref 26.0–34.0)
MCHC: 31.6 g/dL (ref 30.0–36.0)
MCV: 80.5 fL (ref 80.0–100.0)
Platelets: 305 10*3/uL (ref 150–400)
RBC: 4.88 MIL/uL (ref 3.87–5.11)
RDW: 13.4 % (ref 11.5–15.5)
WBC: 12.2 10*3/uL — ABNORMAL HIGH (ref 4.0–10.5)
nRBC: 0 % (ref 0.0–0.2)

## 2019-10-10 LAB — URINALYSIS, COMPLETE (UACMP) WITH MICROSCOPIC
Bilirubin Urine: NEGATIVE
Glucose, UA: NEGATIVE mg/dL
Ketones, ur: NEGATIVE mg/dL
Leukocytes,Ua: NEGATIVE
Nitrite: NEGATIVE
Specific Gravity, Urine: 1.025 (ref 1.005–1.030)
pH: 6 (ref 5.0–8.0)

## 2019-10-10 LAB — BASIC METABOLIC PANEL
Anion gap: 10 (ref 5–15)
BUN: 9 mg/dL (ref 6–20)
CO2: 27 mmol/L (ref 22–32)
Calcium: 9.2 mg/dL (ref 8.9–10.3)
Chloride: 99 mmol/L (ref 98–111)
Creatinine, Ser: 0.58 mg/dL (ref 0.44–1.00)
GFR calc Af Amer: >60 mL/min (ref >60)
GFR calc non Af Amer: >60 mL/min (ref >60)
Glucose, Bld: 110 mg/dL — ABNORMAL HIGH (ref 70–99)
Potassium: 3.5 mmol/L (ref 3.5–5.1)
Sodium: 136 mmol/L (ref 135–145)

## 2019-10-10 LAB — PREGNANCY, URINE: Preg Test, Ur: NEGATIVE

## 2019-10-10 MED ORDER — ONDANSETRON 4 MG PO TBDP: 4.0000 mg | ORAL_TABLET | Freq: Three times a day (TID) | ORAL | 0 refills | Status: DC | PRN

## 2019-10-10 MED ORDER — ONDANSETRON 8 MG PO TBDP
8.0000 mg | ORAL_TABLET | Freq: Once | ORAL | Status: AC
  Administered 2019-10-10: 8 mg via ORAL

## 2019-10-10 MED ORDER — KETOROLAC TROMETHAMINE 60 MG/2ML IM SOLN
60.0000 mg | Freq: Once | INTRAMUSCULAR | Status: AC
  Administered 2019-10-10: 60 mg via INTRAMUSCULAR

## 2019-10-10 MED ORDER — TAMSULOSIN HCL 0.4 MG PO CAPS: 0.4000 mg | ORAL_CAPSULE | Freq: Every day | ORAL | 0 refills | Status: DC

## 2019-10-10 MED ORDER — OXYCODONE-ACETAMINOPHEN 5-325 MG PO TABS: 1.0000 | ORAL_TABLET | Freq: Three times a day (TID) | ORAL | 0 refills | Status: DC | PRN

## 2019-10-10 NOTE — ED Triage Notes (Signed)
Patient in today c/o right flank pain x yesterday. Patient has taken OTC period pain and husband's Hydrocodone 10/325 without relief. Patient states the pain is worse after she urinates. Patient states she has had a kidney stone before ~ 11 years ago.

## 2019-10-10 NOTE — Discharge Instructions (Signed)
It was very nice seeing you today in clinic. Thank you for entrusting me with your care.   Increase fluid intake. Take medications as prescribed. Strain all urine and take any passes stones to urologist.   If your symptoms/condition worsens, please seek follow up care either here or in the ER. Please remember, our [facility] providers are "right here with you" when you need [place].   Again, it was my pleasure to take care of you today. Thank you for choosing our clinic. I hope that you start to feel better quickly.   Quentin Mulling, MSN, APRN, FNP-C, CEN Advanced Practice Provider [facility]

## 2019-10-11 ENCOUNTER — Other Ambulatory Visit: Payer: Self-pay | Admitting: *Deleted

## 2019-10-11 DIAGNOSIS — N200 Calculus of kidney: Secondary | ICD-10-CM

## 2019-10-11 NOTE — ED Provider Notes (Addendum)
[place], [place]   Name: Traci Cooper DOB: 1981-02-23 MRN: [id number] CSN: [id number] PCP: Pleas Koch, NP  Arrival date and time:  10/10/19 1040  Chief Complaint:  Flank Pain (right)  NOTE: Prior to seeing the patient today, I have reviewed the triage nursing documentation and vital signs. Clinical staff has updated patient's PMH/PSHx, current medication list, and drug allergies/intolerances to ensure comprehensive history available to assist in medical decision making.   History:   HPI: Traci Cooper is a 39 y.o. female who presents today with complaints of RIGHT flank pain that began with acute onset yesterday. Pain has worsened since onset. Patient notes that the pain increases significantly with micturition. She is voiding small amounts and having to go more frequently. Patient has experienced nausea and vomiting associated with her current symptoms. She denies fevers, chills, and abdominal pain. She has not appreciated any gross hematuria, vaginal bleeding, or vaginal discharge. PMH (+) for urolithiasis, with her last stone being about 11 years ago. She has had a stone analysis in the past, however she is unsure of the composition of her stones. In efforts to conservatively manage her symptoms at home, the patient notes that she has used menstrual mediation, which has not helped to improve her symptoms. Approximately 1 hour PTA, patient took one of her husband's Norco 10/325 mg tablets. She presents to clinic today pale and diaphoretic complaining of significant pain and nausea.   Past Medical History:  Diagnosis Date  . Anxiety   . Chicken pox   . Complication of anesthesia    woke up during appendectomy  . GERD (gastroesophageal reflux disease)   . History of kidney stones   . Pneumonia   . Urinary tract infection     Past Surgical History:  Procedure Laterality Date  . APPENDECTOMY  1996 or 1997  . DILATION AND EVACUATION N/A 05/13/2019   Procedure: DILATATION AND  EVACUATION;  Surgeon: [name], MD;  Location: [facility] ORS;  Service: Gynecology;  Laterality: N/A;    Family History  Problem Relation Age of Onset  . Stroke Maternal Grandfather   . Hypertension Maternal Grandfather   . Hyperlipidemia Mother   . Hypertension Mother   . Kidney Stones Mother   . Other Father        unknown medical history    Social History   Tobacco Use  . Smoking status: Never Smoker  . Smokeless tobacco: Never Used  Substance Use Topics  . Alcohol use: Yes    Comment: rarely  . Drug use: No    Patient Active Problem List   Diagnosis Date Noted  . Family history of autosomal aneuploidy   . Positive pregnancy test 04/20/2019  . Hyperlipidemia 02/18/2019  . GAD (generalized anxiety disorder) 03/11/2018  . 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 Preventative health care 05/03/2016  . Esophageal pain 04/08/2016    Home Medications:    Current Meds  Medication Sig  . DULoxetine (CYMBALTA) 30 MG capsule Take 1 capsule (30 mg total) by mouth every evening. For anxiety.    Allergies:   Erythromycin and Tetracyclines & related  Review of Systems (ROS):  Review of systems NEGATIVE unless otherwise noted in narrative H&P section.   Vital Signs: Today's Vitals   10/10/19 1057 10/10/19 1307  BP: 130/77   Pulse: 91   Resp: 18   Temp: 98.1 F (36.7 C)   TempSrc: Oral   SpO2: 97%   Weight: 113 lb (51.3 kg)  Height: 5\' 3"  (1.6 m)   PainSc: 3  1     Physical Exam: Physical Exam  Constitutional: She is oriented to person, place, and time. She appears distressed (2/2 acute pain and nausea).  HENT:  Head: Normocephalic and atraumatic.  Eyes: Pupils are equal, round, and reactive to light.  Cardiovascular: Normal rate, regular rhythm, normal heart sounds and intact distal pulses.  Pulmonary/Chest: Effort normal and breath sounds normal.  Abdominal: Soft. Normal appearance and bowel sounds are normal. She exhibits no  distension. There is no abdominal tenderness. There is CVA tenderness (RIGHT).  Neurological: She is alert and oriented to person, place, and time. Gait normal.  Skin: Skin is warm. No rash noted. She is diaphoretic. There is pallor.  Psychiatric: Memory, affect and judgment normal. Her mood appears anxious.  Nursing note and vitals reviewed.   Urgent Care Treatments / Results:   Orders Placed This Encounter  Procedures  . Urine culture  . CT Renal Stone Study  . Urinalysis, Complete w Microscopic  . Pregnancy, urine  . CBC  . Basic metabolic panel    LABS: PLEASE NOTE: all labs that were ordered this encounter are listed, however only abnormal results are displayed. Labs Reviewed  URINALYSIS, COMPLETE (UACMP) WITH MICROSCOPIC - Abnormal; Notable for the following components:      Result Value   APPearance HAZY (*)    Hgb urine dipstick MODERATE (*)    Protein, ur TRACE (*)    Bacteria, UA FEW (*)    All other components within normal limits  CBC - Abnormal; Notable for the following components:   WBC 12.2 (*)    MCH 25.4 (*)    All other components within normal limits  BASIC METABOLIC PANEL - Abnormal; Notable for the following components:   Glucose, Bld 110 (*)    All other components within normal limits  URINE CULTURE  PREGNANCY, URINE    EKG: -None  RADIOLOGY: CT Renal Stone Study  Result Date: 10/10/2019 CLINICAL DATA:  Right flank pain extending of the right pelvic region. Nausea. Pain worsens after voiding. EXAM: CT ABDOMEN AND PELVIS WITHOUT CONTRAST TECHNIQUE: Multidetector CT imaging of the abdomen and pelvis was performed following the standard protocol without IV contrast. COMPARISON:  None. FINDINGS: Lower chest: Unremarkable Hepatobiliary: Unremarkable Pancreas: Unremarkable Spleen: Unremarkable Adrenals/Urinary Tract: Mild to moderate right hydronephrosis related to a 0.5 by 0.3 by 0.4 cm proximal ureteral calculus immediately adjacent to the UPJ. The  right ureter distal to this point is of normal caliber There are 5 nonobstructive left renal calculi measuring up to 0.5 cm in diameter. Urinary bladder and adrenal glands unremarkable. Stomach/Bowel: Unremarkable. Appendix absent. Vascular/Lymphatic: Unremarkable Reproductive: Unremarkable Other: Trace free pelvic fluid, likely physiologic. Musculoskeletal: Small central disc protrusion at L5-S1, without impingement. IMPRESSION: 1. Mild to moderate right hydronephrosis related to a 0.5 by 0.3 by 0.4 cm proximal ureteral calculus immediately adjacent to the UPJ. 2. There are 5 nonobstructive left renal calculi measuring up to 0.5 cm in diameter. 3. Small central disc protrusion at L5-S1, without impingement. Electronically Signed   By: Gaylyn RongWalter  Liebkemann M.D.   On: 10/10/2019 12:26    PROCEDURES: Procedures  MEDICATIONS RECEIVED THIS VISIT: Medications  ondansetron (ZOFRAN-ODT) disintegrating tablet 8 mg (8 mg Oral Given 10/10/19 1133)  ketorolac (TORADOL) injection 60 mg (60 mg Intramuscular Given 10/10/19 1133)    PERTINENT CLINICAL COURSE NOTES/UPDATES:   Initial Impression / Assessment and Plan / Urgent Care Course:  Pertinent labs & imaging  results that were available during my care of the patient were personally reviewed by me and considered in my medical decision making (see lab/imaging section of note for values and interpretations).  Traci Cooper is a 39 y.o. female who presents to [facility] today with complaints of Flank Pain (right)  Patient presents to clinic today acutely ill appearing (non-toxic). She appears to be in mild to moderate acute distress due to her acute pain and nausea. Presenting symptoms (see HPI) and exam as documented above. Patient with pain since yesterday. She has (+) nausea and vomiting. No fevers. Patient is pale and diaphoretic. PMH (+) for urolithiasis. Discussed HIGH SUSPICION for recurrent urinary calculus, and I advised patient that I was limited on  what I have available for pain control in the urgent care setting. Patient given the option of pursuing further care here versus going to the emergency department for further evaluation and pain management. Patient verbalizes her desires to remain here in clinic. I advised her that I would do my best to make her comfortable and determine the source of her current symptoms.   Patient given ondansetron 8 mg ODT and ketorolac 60 mg IM in clinic in efforts to control her acute nausea and start to improve her pain. Again, patient took Norco 10/325 mg tablet 1 hour PTA. Nausea improved. Pain reduced down to a 1/10 in severity with aforementioned interventions.    UA collected, however sample volume was minimal (< 10 mL urine provided). Sample was hazy and revealed 6-10 WBC/hpf, 6-10 RBC/hpf, few bacteria, and moderate Hgb; no nitrites or LE. Will attempt to send reflex C&S on remaining limited sample.   Urine hCG (-).    WBC 12.2 K/uL. Hgb 12.4, Hct 39.3, MCV 80.5, MCH 25.4, and platelets 305 K/uL.    Na 136, K+ 3.5, glucose 110, BUN 9, and creatinine 0.58 mg/dL. Estimated Creatinine Clearance: 77.2 mL/min (by C-G formula based on SCr of 0.58 mg/dL).    CT urogram revealed multiple BILATERAL urolithiasis (one on RIGHT and five on the LEFT; total of six visible stones on imaging). Stone of concern measures 0.5 x 0.3 x 0.4 cm and  is on the RIGHT at the location of the UPJ. Calculus causing mild to moderate hydronephrosis with no evidence of hydroureter.   Results reviewed with patient in detail. Discussed concern about location and the already present hydronephrosis. Patient wishes to avoid the hospital if possible. Discussed patient presentation and workup with urologist on call Annabell Howells, MD) who advised that if patient not febrile, is voiding, and her pain/nausea is controlled, then she would be ok for outpatient follow up with urology. Appreciate urology taking the time to consult on this patient with  me via CH[facility]. POC discussed with patient, who verbalizes understanding and agreement with plans as they stand at this point. While waiting to see urology, will continue symptomatic care at home as follows:   Tamsulosin 0.4 mg daily to help facilitate urinary dilation in hopes of promoting the passage of the patient's urinary calculus.    Patient encouraged to increase her fluid intake as much as possible in efforts to flush urinary tract, again in efforts to promote passage of the calculus. Discussed that water is best. Will send in a supply of ondansetron ODT tablets for patient to use on a PRN basis for her nausea.   Pain controlled in clinic with ketorolac as I had no other options to use. Given the proximal positioning of the calculus, patient was  advised to avoid further NSAIDs in the event that ESWL is considered as a treatment modality by attending urologist. Patient may use APAP as needed for mild pain. Will send in a supply of Percocet 5/325 mg tablets for patient to use on a PRN basis; indications and side effects reviewed.    Patient needs to be seen for further evaluation by urology. Dr. Annabell Howells advised that he would send a message to the office to have patient worked into the schedule this week.  Name and office contact information provided on today's AVS for [facility]. Patient advised the she should be contacted by the practice to schedule an appointment, however is she has not heard from them by tomorrow afternoon, she was encouraged to contact the office to schedule an appointment to be seen.   Current clinical condition warrants patient being out of work in order to recover from her current injury/illness. She was provided with the appropriate documentation to provide to her place of employment that will allow for her to RTW on 10/14/2019 with no restrictions.   I have reviewed the follow up and strict return precautions for any new or worsening symptoms. Patient is aware of  symptoms that would be deemed urgent/emergent, and would thus require further evaluation either here or in the emergency department. At the time of discharge, she verbalized understanding and consent with the discharge plan as it was reviewed with her. All questions were fielded by provider and/or clinic staff prior to patient discharge.    Final Clinical Impressions / Urgent Care Diagnoses:   Final diagnoses:  Kidney stones  Hydronephrosis concurrent with and due to calculi of kidney and ureter  Non-intractable vomiting with nausea, unspecified vomiting type    New Prescriptions:  [place] Controlled Substance Registry consulted? Yes, I have consulted the [place] Controlled Substances Registry for this patient, and feel the risk/benefit ratio today is favorable for proceeding with this prescription for a controlled substance.  . Discussed use of controlled substance medication to treat her acute pain.  o Reviewed [place] STOP Act regulations  o Clinic does not refill controlled substances over the phone without face to face evaluation.  . Safety precautions reviewed.  o Medications should not be bitten, chewed, sold, or taken with alcohol.  o Avoid use while working, driving, or operating heavy machinery.  o Side effects associated with the use of this particular medication reviewed. - Patient understands that this medication can cause CNS depression, increase her risk of falls, and even lead to overdose that may result in death, if used outside of the parameters that she and I discussed.  With all of this in mind, she knowingly accepts the risks and responsibilities associated with intended course of treatment, and elects to responsibly proceed as discussed.  Meds ordered this encounter  Medications  . ondansetron (ZOFRAN-ODT) disintegrating tablet 8 mg  . ketorolac (TORADOL) injection 60 mg  . ondansetron (ZOFRAN-ODT) 4 MG disintegrating tablet    Sig: Take 1 tablet (4 mg total) by mouth every 8  (eight) hours as needed.    Dispense:  15 tablet    Refill:  0  . oxyCODONE-acetaminophen (PERCOCET) 5-325 MG tablet    Sig: Take 1 tablet by mouth every 8 (eight) hours as needed for severe pain.    Dispense:  12 tablet    Refill:  0  . tamsulosin (FLOMAX) 0.4 MG CAPS capsule    Sig: Take 1 capsule (0.4 mg total) by mouth daily.    Dispense:  30 capsule    Refill:  0    Recommended Follow up Care:  Patient encouraged to follow up with the following provider within the specified time frame, or sooner as dictated by the severity of her symptoms. As always, she was instructed that for any urgent/emergent care needs, she should seek care either here or in the emergency department for more immediate evaluation.  Follow-up Information    Schedule an appointment as soon as possible for a visit  with Bjorn Pippin, MD.   Specialty: Urology Why: They are aware. I spoke with Dr. Annabell Howells today. He is going to message the office for an appointment for you. I would call them tomorrow afternoon if you have not heard anything. Contact information: [address] [place] [address] [phone]         NOTE: This note was prepared using Dragon dictation software along with smaller phrase technology. Despite my best ability to proofread, there is the potential that transcriptional errors may still occur from this process, and are completely unintentional.      Verlee Monte, NP 10/12/19 [phone]

## 2019-10-12 ENCOUNTER — Other Ambulatory Visit: Payer: Self-pay

## 2019-10-12 ENCOUNTER — Other Ambulatory Visit: Payer: Self-pay | Admitting: Radiology

## 2019-10-12 ENCOUNTER — Ambulatory Visit: Payer: Commercial Managed Care - PPO | Admitting: Urology

## 2019-10-12 ENCOUNTER — Encounter: Payer: Self-pay | Admitting: Urgent Care

## 2019-10-12 ENCOUNTER — Encounter: Payer: Self-pay | Admitting: Urology

## 2019-10-12 ENCOUNTER — Ambulatory Visit
Admission: RE | Admit: 2019-10-12 | Discharge: 2019-10-12 | Disposition: A | Discharge status: Home / Self Care | Payer: Commercial Managed Care - PPO | Source: Ambulatory Visit | Attending: Urology | Admitting: Urology

## 2019-10-12 ENCOUNTER — Other Ambulatory Visit
Admission: RE | Admit: 2019-10-12 | Discharge: 2019-10-12 | Disposition: A | Discharge status: Home / Self Care | Payer: Commercial Managed Care - PPO | Source: Ambulatory Visit | Attending: Urology | Admitting: Urology

## 2019-10-12 VITALS — BP 120/80 | HR 74 | Ht 63.0 in | Wt 110.0 lb

## 2019-10-12 DIAGNOSIS — Z87442 Personal history of urinary calculi: Secondary | ICD-10-CM | POA: Insufficient documentation

## 2019-10-12 DIAGNOSIS — Z01812 Encounter for preprocedural laboratory examination: Secondary | ICD-10-CM | POA: Insufficient documentation

## 2019-10-12 DIAGNOSIS — N200 Calculus of kidney: Secondary | ICD-10-CM

## 2019-10-12 DIAGNOSIS — N201 Calculus of ureter: Secondary | ICD-10-CM

## 2019-10-12 DIAGNOSIS — N23 Unspecified renal colic: Secondary | ICD-10-CM | POA: Diagnosis not present

## 2019-10-12 DIAGNOSIS — N132 Hydronephrosis with renal and ureteral calculous obstruction: Secondary | ICD-10-CM

## 2019-10-12 DIAGNOSIS — R109 Unspecified abdominal pain: Secondary | ICD-10-CM | POA: Diagnosis not present

## 2019-10-12 DIAGNOSIS — Z20822 Contact with and (suspected) exposure to covid-19: Secondary | ICD-10-CM | POA: Insufficient documentation

## 2019-10-12 LAB — URINE CULTURE

## 2019-10-12 NOTE — ED Notes (Signed)
Called [facility] for Prior Authorization for [phone]. Spoke with Alroy Dust and Prior Authorization is not req'd for this procedure. [facility]

## 2019-10-13 ENCOUNTER — Encounter: Payer: Self-pay | Admitting: Urology

## 2019-10-13 DIAGNOSIS — N200 Calculus of kidney: Secondary | ICD-10-CM | POA: Insufficient documentation

## 2019-10-13 LAB — SARS CORONAVIRUS 2 (TAT 6-24 HRS): SARS Coronavirus 2: NEGATIVE

## 2019-10-13 NOTE — Progress Notes (Signed)
10/12/2019 7:58 AM   Traci Cooper 04-Jul-1980 [id number]  Referring provider: Pleas Koch, NP [address],  [place] [address]  Chief Complaint  Patient presents with  . Nephrolithiasis    HPI: Traci Cooper is a 39 y.o. female who presents for urgent care follow-up of a recently diagnosed right ureteral calculus.  -Presented to [facility] 10/10/19 with 1 day history worsening pain in the right upper quadrant at the anterior axillary line radiating to the lower quadrant -No identifiable precipitating, aggravating or alleviating factors -Positive nausea/vomiting -No fever/chills -Stone protocol CT with a 5 mm right proximal ureteral calculus with mild to moderate hydronephrosis/hydroureter -Multiple bilateral nonobstructing left renal calculi -Continues with intermittent pain controlled with oxycodone -Taking tamsulosin   PMH: Past Medical History:  Diagnosis Date  . Anxiety   . Chicken pox   . Complication of anesthesia    woke up during appendectomy  . GERD (gastroesophageal reflux disease)   . History of kidney stones   . Pneumonia   . Urinary tract infection     Surgical History: Past Surgical History:  Procedure Laterality Date  . APPENDECTOMY  1996 or 1997  . DILATION AND EVACUATION N/A 05/13/2019   Procedure: DILATATION AND EVACUATION;  Surgeon: [name], MD;  Location: [facility]S;  Service: Gynecology;  Laterality: N/A;    Home Medications:  Allergies as of 10/12/2019      Reactions   Erythromycin Swelling   Tetracyclines & Related Nausea And Vomiting      Medication List       Accurate as of October 12, 2019 11:59 PM. If you have any questions, ask your nurse or doctor.        DULoxetine 30 MG capsule Commonly known as: CYMBALTA Take 1 capsule (30 mg total) by mouth every evening. For anxiety.   ondansetron 4 MG disintegrating tablet Commonly known as: ZOFRAN-ODT Take 1 tablet (4 mg total) by mouth every 8 (eight)  hours as needed.   oxyCODONE-acetaminophen 5-325 MG tablet Commonly known as: Percocet Take 1 tablet by mouth every 8 (eight) hours as needed for severe pain.   tamsulosin 0.4 MG Caps capsule Commonly known as: FLOMAX Take 1 capsule (0.4 mg total) by mouth daily.       Allergies:  Allergies  Allergen Reactions  . Erythromycin Swelling  . Tetracyclines & Related Nausea And Vomiting    Family History: Family History  Problem Relation Age of Onset  . Stroke Maternal Grandfather   . Hypertension Maternal Grandfather   . Hyperlipidemia Mother   . Hypertension Mother   . Kidney Stones Mother   . Other Father        unknown medical history    Social History:  reports that she has never smoked. She has never used smokeless tobacco. She reports current alcohol use. She reports that she does not use drugs.   Physical Exam: BP 120/80   Pulse 74   Ht 5\' 3"  (1.6 m)   Wt 110 lb (49.9 kg)   LMP 09/25/2019 (Exact Date)   BMI 19.49 kg/m   Constitutional:  Alert and oriented, No acute distress. HEENT: [place] AT, moist mucus membranes.  Trachea midline, no masses. Cardiovascular: No clubbing, cyanosis, or edema.  RRR Respiratory: Normal respiratory effort, no increased work of breathing.  Clear GI: Abdomen is soft, nontender, nondistended, no abdominal masses GU: No CVA tenderness Skin: No rashes, bruises or suspicious lesions. Neurologic: Grossly intact, no focal deficits, moving all  4 extremities. Psychiatric: Normal mood and affect.   Pertinent Imaging: KUB and CT personally reviewed Results for orders placed during the hospital encounter of 10/12/19  Abdomen 1 view (KUB)   Narrative CLINICAL DATA:  Right flank pain for 3 days. History of kidney stones.  EXAM: ABDOMEN - 1 VIEW  COMPARISON:  CT 10/10/2019.  FINDINGS: The position of the 5 mm proximal right ureteral calculus is not significantly changed, located between the right L2 and L3 transverse processes. Left  renal calculi and bilateral pelvic phleboliths are grossly stable. The bowel gas pattern is nonobstructive. There is mildly prominent stool in the proximal colon. No acute osseous findings.  IMPRESSION: Unchanged position of obstructing proximal right ureteral calculus compared with CT of 2 days ago.   Electronically Signed   By: William  Veazey M.D.   On: 10/12/2019 15:24     Results for orders placed during the hospital encounter of 10/10/19  CT Renal Stone Study   Narrative CLINICAL DATA:  Right flank pain extending of the right pelvic region. Nausea. Pain worsens after voiding.  EXAM: CT ABDOMEN AND PELVIS WITHOUT CONTRAST  TECHNIQUE: Multidetector CT imaging of the abdomen and pelvis was performed following the standard protocol without IV contrast.  COMPARISON:  None.  FINDINGS: Lower chest: Unremarkable  Hepatobiliary: Unremarkable  Pancreas: Unremarkable  Spleen: Unremarkable  Adrenals/Urinary Tract: Mild to moderate right hydronephrosis related to a 0.5 by 0.3 by 0.4 cm proximal ureteral calculus immediately adjacent to the UPJ. The right ureter distal to this point is of normal caliber  There are 5 nonobstructive left renal calculi measuring up to 0.5 cm in diameter.  Urinary bladder and adrenal glands unremarkable.  Stomach/Bowel: Unremarkable. Appendix absent.  Vascular/Lymphatic: Unremarkable  Reproductive: Unremarkable  Other: Trace free pelvic fluid, likely physiologic.  Musculoskeletal: Small central disc protrusion at L5-S1, without impingement.  IMPRESSION: 1. Mild to moderate right hydronephrosis related to a 0.5 by 0.3 by 0.4 cm proximal ureteral calculus immediately adjacent to the UPJ. 2. There are 5 nonobstructive left renal calculi measuring up to 0.5 cm in diameter. 3. Small central disc protrusion at L5-S1, without impingement.   Electronically Signed   By: Walter  Liebkemann M.D.   On: 10/10/2019 12:26     Assessment  & Plan:    - Right proximal ureteral calculus No significant progression on today's KUB.  We discussed various treatment options for urolithiasis including observation with or without medical expulsive therapy, shockwave lithotripsy (SWL), ureteroscopy and laser lithotripsy with stent placement.  We discussed that management is based on stone size, location, density, patient co-morbidities, and patient preference.   Stones <5mm in size have a >80% spontaneous passage rate. Data surrounding the use of tamsulosin for medical expulsive therapy is controversial, but meta analyses suggests it is most efficacious for distal stones between 5-10mm in size. Possible side effects include dizziness/lightheadedness, and retrograde ejaculation.  SWL has a lower stone free rate in a single procedure, but also a lower complication rate compared to ureteroscopy and avoids a stent and associated stent related symptoms. Possible complications include renal hematoma, steinstrasse, and need for additional treatment.  Ureteroscopy with laser lithotripsy and stent placement has a higher stone free rate than SWL in a single procedure, however increased complication rate including possible infection, ureteral injury, bleeding, and stent related morbidity. Common stent related symptoms include dysuria, urgency/frequency, and flank pain.  After an extensive discussion of the risks and benefits of the above treatment options, the patient would like to proceed   with shockwave lithotripsy.  - Nephrolithiasis Multiple, nonobstructing left renal calculi.  Recommend metabolic stone evaluation after treatment of her ureteral calculus.   Riki Altes, MD  [facility] [address], [address] [place], [place] [address] [phone]

## 2019-10-13 NOTE — H&P (View-Only) (Signed)
10/12/2019 7:58 AM   Traci Cooper 04-Jul-1980 [id number]  Referring provider: Pleas Koch, NP [place],  [place] [address]  Chief Complaint  Patient presents with  . Nephrolithiasis    HPI: Traci Cooper is a 39 y.o. female who presents for urgent care follow-up of a recently diagnosed right ureteral calculus.  -Presented to [facility] 10/10/19 with 1 day history worsening pain in the right upper quadrant at the anterior axillary line radiating to the lower quadrant -No identifiable precipitating, aggravating or alleviating factors -Positive nausea/vomiting -No fever/chills -Stone protocol CT with a 5 mm right proximal ureteral calculus with mild to moderate hydronephrosis/hydroureter -Multiple bilateral nonobstructing left renal calculi -Continues with intermittent pain controlled with oxycodone -Taking tamsulosin   PMH: Past Medical History:  Diagnosis Date  . Anxiety   . Chicken pox   . Complication of anesthesia    woke up during appendectomy  . GERD (gastroesophageal reflux disease)   . History of kidney stones   . Pneumonia   . Urinary tract infection     Surgical History: Past Surgical History:  Procedure Laterality Date  . APPENDECTOMY  1996 or 1997  . DILATION AND EVACUATION N/A 05/13/2019   Procedure: DILATATION AND EVACUATION;  Surgeon: [name], MD;  Location: [facility] ORS;  Service: Gynecology;  Laterality: N/A;    Home Medications:  Allergies as of 10/12/2019      Reactions   Erythromycin Swelling   Tetracyclines & Related Nausea And Vomiting      Medication List       Accurate as of October 12, 2019 11:59 PM. If you have any questions, ask your nurse or doctor.        DULoxetine 30 MG capsule Commonly known as: CYMBALTA Take 1 capsule (30 mg total) by mouth every evening. For anxiety.   ondansetron 4 MG disintegrating tablet Commonly known as: ZOFRAN-ODT Take 1 tablet (4 mg total) by mouth every 8 (eight)  hours as needed.   oxyCODONE-acetaminophen 5-325 MG tablet Commonly known as: Percocet Take 1 tablet by mouth every 8 (eight) hours as needed for severe pain.   tamsulosin 0.4 MG Caps capsule Commonly known as: FLOMAX Take 1 capsule (0.4 mg total) by mouth daily.       Allergies:  Allergies  Allergen Reactions  . Erythromycin Swelling  . Tetracyclines & Related Nausea And Vomiting    Family History: Family History  Problem Relation Age of Onset  . Stroke Maternal Grandfather   . Hypertension Maternal Grandfather   . Hyperlipidemia Mother   . Hypertension Mother   . Kidney Stones Mother   . Other Father        unknown medical history    Social History:  reports that she has never smoked. She has never used smokeless tobacco. She reports current alcohol use. She reports that she does not use drugs.   Physical Exam: BP 120/80   Pulse 74   Ht 5\' 3"  (1.6 m)   Wt 110 lb (49.9 kg)   LMP 09/25/2019 (Exact Date)   BMI 19.49 kg/m   Constitutional:  Alert and oriented, No acute distress. HEENT: [place] AT, moist mucus membranes.  Trachea midline, no masses. Cardiovascular: No clubbing, cyanosis, or edema.  RRR Respiratory: Normal respiratory effort, no increased work of breathing.  Clear GI: Abdomen is soft, nontender, nondistended, no abdominal masses GU: No CVA tenderness Skin: No rashes, bruises or suspicious lesions. Neurologic: Grossly intact, no focal deficits, moving all  4 extremities. Psychiatric: Normal mood and affect.   Pertinent Imaging: KUB and CT personally reviewed Results for orders placed during the hospital encounter of 10/12/19  Abdomen 1 view (KUB)   Narrative CLINICAL DATA:  Right flank pain for 3 days. History of kidney stones.  EXAM: ABDOMEN - 1 VIEW  COMPARISON:  CT 10/10/2019.  FINDINGS: The position of the 5 mm proximal right ureteral calculus is not significantly changed, located between the right L2 and L3 transverse processes. Left  renal calculi and bilateral pelvic phleboliths are grossly stable. The bowel gas pattern is nonobstructive. There is mildly prominent stool in the proximal colon. No acute osseous findings.  IMPRESSION: Unchanged position of obstructing proximal right ureteral calculus compared with CT of 2 days ago.   Electronically Signed   By: Carey Bullocks M.D.   On: 10/12/2019 15:24     Results for orders placed during the hospital encounter of 10/10/19  CT Renal Stone Study   Narrative CLINICAL DATA:  Right flank pain extending of the right pelvic region. Nausea. Pain worsens after voiding.  EXAM: CT ABDOMEN AND PELVIS WITHOUT CONTRAST  TECHNIQUE: Multidetector CT imaging of the abdomen and pelvis was performed following the standard protocol without IV contrast.  COMPARISON:  None.  FINDINGS: Lower chest: Unremarkable  Hepatobiliary: Unremarkable  Pancreas: Unremarkable  Spleen: Unremarkable  Adrenals/Urinary Tract: Mild to moderate right hydronephrosis related to a 0.5 by 0.3 by 0.4 cm proximal ureteral calculus immediately adjacent to the UPJ. The right ureter distal to this point is of normal caliber  There are 5 nonobstructive left renal calculi measuring up to 0.5 cm in diameter.  Urinary bladder and adrenal glands unremarkable.  Stomach/Bowel: Unremarkable. Appendix absent.  Vascular/Lymphatic: Unremarkable  Reproductive: Unremarkable  Other: Trace free pelvic fluid, likely physiologic.  Musculoskeletal: Small central disc protrusion at L5-S1, without impingement.  IMPRESSION: 1. Mild to moderate right hydronephrosis related to a 0.5 by 0.3 by 0.4 cm proximal ureteral calculus immediately adjacent to the UPJ. 2. There are 5 nonobstructive left renal calculi measuring up to 0.5 cm in diameter. 3. Small central disc protrusion at L5-S1, without impingement.   Electronically Signed   By: Gaylyn Rong M.D.   On: 10/10/2019 12:26     Assessment  & Plan:    - Right proximal ureteral calculus No significant progression on today's KUB.  We discussed various treatment options for urolithiasis including observation with or without medical expulsive therapy, shockwave lithotripsy (SWL), ureteroscopy and laser lithotripsy with stent placement.  We discussed that management is based on stone size, location, density, patient co-morbidities, and patient preference.   Stones <42mm in size have a >80% spontaneous passage rate. Data surrounding the use of tamsulosin for medical expulsive therapy is controversial, but meta analyses suggests it is most efficacious for distal stones between 5-72mm in size. Possible side effects include dizziness/lightheadedness, and retrograde ejaculation.  SWL has a lower stone free rate in a single procedure, but also a lower complication rate compared to ureteroscopy and avoids a stent and associated stent related symptoms. Possible complications include renal hematoma, steinstrasse, and need for additional treatment.  Ureteroscopy with laser lithotripsy and stent placement has a higher stone free rate than SWL in a single procedure, however increased complication rate including possible infection, ureteral injury, bleeding, and stent related morbidity. Common stent related symptoms include dysuria, urgency/frequency, and flank pain.  After an extensive discussion of the risks and benefits of the above treatment options, the patient would like to proceed  with shockwave lithotripsy.  - Nephrolithiasis Multiple, nonobstructing left renal calculi.  Recommend metabolic stone evaluation after treatment of her ureteral calculus.   Riki Altes, MD  [facility] [address], [address] [place], [place] [address] [phone]

## 2019-10-14 ENCOUNTER — Encounter: Payer: Self-pay | Admitting: Urology

## 2019-10-14 ENCOUNTER — Ambulatory Visit
Admission: RE | Admit: 2019-10-14 | Discharge: 2019-10-14 | Disposition: A | Discharge status: Home / Self Care | Payer: Commercial Managed Care - PPO | Attending: Urology | Admitting: Urology

## 2019-10-14 ENCOUNTER — Other Ambulatory Visit: Payer: Self-pay

## 2019-10-14 ENCOUNTER — Encounter
Admission: RE | Disposition: A | Discharge status: Home / Self Care | Payer: Self-pay | Source: Home / Self Care | Attending: Urology

## 2019-10-14 DIAGNOSIS — K219 Gastro-esophageal reflux disease without esophagitis: Secondary | ICD-10-CM | POA: Insufficient documentation

## 2019-10-14 DIAGNOSIS — N132 Hydronephrosis with renal and ureteral calculous obstruction: Secondary | ICD-10-CM | POA: Insufficient documentation

## 2019-10-14 DIAGNOSIS — F419 Anxiety disorder, unspecified: Secondary | ICD-10-CM | POA: Diagnosis not present

## 2019-10-14 DIAGNOSIS — Z79899 Other long term (current) drug therapy: Secondary | ICD-10-CM | POA: Diagnosis not present

## 2019-10-14 DIAGNOSIS — Z881 Allergy status to other antibiotic agents status: Secondary | ICD-10-CM | POA: Diagnosis not present

## 2019-10-14 DIAGNOSIS — N200 Calculus of kidney: Secondary | ICD-10-CM

## 2019-10-14 DIAGNOSIS — N201 Calculus of ureter: Secondary | ICD-10-CM

## 2019-10-14 HISTORY — PX: EXTRACORPOREAL SHOCK WAVE LITHOTRIPSY: SHX1557

## 2019-10-14 LAB — HCG, QUANTITATIVE, PREGNANCY: hCG, Beta Chain, Quant, S: <1 m[IU]/mL (ref <5)

## 2019-10-14 SURGERY — LITHOTRIPSY, ESWL
Anesthesia: Moderate Sedation | Laterality: Right

## 2019-10-14 MED ORDER — DIPHENHYDRAMINE HCL 25 MG PO CAPS: 25.0000 mg | ORAL_CAPSULE | ORAL | Status: DC

## 2019-10-14 MED ORDER — ONDANSETRON HCL 4 MG/2ML IJ SOLN: 4.0000 mg | Freq: Once | INTRAMUSCULAR | Status: DC | PRN

## 2019-10-14 MED ORDER — CIPROFLOXACIN HCL 500 MG PO TABS: 500.0000 mg | ORAL_TABLET | ORAL | Status: DC

## 2019-10-14 MED ORDER — DIAZEPAM 5 MG PO TABS: 10.0000 mg | ORAL_TABLET | ORAL | Status: DC

## 2019-10-14 MED ORDER — SODIUM CHLORIDE 0.9 % IV SOLN: INTRAVENOUS | Status: DC

## 2019-10-14 NOTE — OR Nursing (Signed)
Pt unsuccessful in voiding for urine preg test. Dr. Apolinar Junes called at this time, verbal order for hcg beta given. Lab called to bedside to collect.

## 2019-10-14 NOTE — OR Nursing (Addendum)
Per Dr. Apolinar Junes, medicate pt after hcg beta is resulted.

## 2019-10-14 NOTE — OR Nursing (Signed)
Patient desires discharge, instructions given to husband and patient both verbalize understanding.  Small area of redness noted to right lower flank, skin intact. Discharge via wheelchair to home with husband.

## 2019-10-14 NOTE — OR Nursing (Signed)
Per Dr. Apolinar Junes, pt does not need KUB or medication prior to litho at this time.

## 2019-10-14 NOTE — Discharge Instructions (Signed)
See [facility] discharge instructions in chart.  AMBULATORY SURGERY  DISCHARGE INSTRUCTIONS   1) The drugs that you were given will stay in your system until tomorrow so for the next 24 hours you should not:  A) Drive an automobile B) Make any legal decisions C) Drink any alcoholic beverage   2) You may resume regular meals tomorrow.  Today it is better to start with liquids and gradually work up to solid foods.  You may eat anything you prefer, but it is better to start with liquids, then soup and crackers, and gradually work up to solid foods.   3) Please notify your doctor immediately if you have any unusual bleeding, trouble breathing, redness and pain at the surgery site, drainage, fever, or pain not relieved by medication.    4) Additional Instructions:        Please contact your physician with any problems or Same Day Surgery at [phone], Monday through Friday 6 am to 4 pm, or [facility] Main number at [phone].  

## 2019-10-14 NOTE — Interval H&P Note (Signed)
History and Physical Interval Note:  10/14/2019 12:45 PM  Traci Cooper  has presented today for surgery, with the diagnosis of Kidney stone.  The various methods of treatment have been discussed with the patient and family. After consideration of risks, benefits and other options for treatment, the patient has consented to  Procedure(s): EXTRACORPOREAL SHOCK WAVE LITHOTRIPSY (ESWL) (Right) as a surgical intervention.  The patient's history has been reviewed, patient examined, no change in status, stable for surgery.  I have reviewed the patient's chart and labs.  Questions were answered to the patient's satisfaction.     Vanna Scotland

## 2019-10-15 ENCOUNTER — Other Ambulatory Visit: Payer: Self-pay | Admitting: Radiology

## 2019-10-15 DIAGNOSIS — N200 Calculus of kidney: Secondary | ICD-10-CM

## 2019-10-28 ENCOUNTER — Ambulatory Visit: Payer: Commercial Managed Care - PPO | Admitting: Urology

## 2019-10-28 ENCOUNTER — Ambulatory Visit
Admission: RE | Admit: 2019-10-28 | Discharge: 2019-10-28 | Disposition: A | Discharge status: Home / Self Care | Payer: Commercial Managed Care - PPO | Source: Ambulatory Visit | Attending: Urology | Admitting: Urology

## 2019-10-28 ENCOUNTER — Other Ambulatory Visit: Payer: Self-pay

## 2019-10-28 ENCOUNTER — Encounter: Payer: Self-pay | Admitting: Urology

## 2019-10-28 ENCOUNTER — Ambulatory Visit (INDEPENDENT_AMBULATORY_CARE_PROVIDER_SITE_OTHER): Payer: Commercial Managed Care - PPO | Admitting: Urology

## 2019-10-28 VITALS — BP 147/88 | HR 94 | Ht 63.0 in | Wt 120.0 lb

## 2019-10-28 DIAGNOSIS — N200 Calculus of kidney: Secondary | ICD-10-CM | POA: Insufficient documentation

## 2019-10-28 DIAGNOSIS — N201 Calculus of ureter: Secondary | ICD-10-CM

## 2019-10-28 NOTE — Patient Instructions (Signed)

## 2019-11-04 ENCOUNTER — Other Ambulatory Visit: Payer: Self-pay | Admitting: Urology

## 2019-11-05 ENCOUNTER — Telehealth: Payer: Self-pay | Admitting: Family Medicine

## 2019-11-05 NOTE — Telephone Encounter (Signed)
LMOM for patient to return call.

## 2019-11-05 NOTE — Telephone Encounter (Signed)
Patient notified of the result. Patient states she is coming on Monday for lab work and wants to pick up the stone book for our office. I put the book in the lab for patient to pick up on Monday.

## 2019-11-05 NOTE — Telephone Encounter (Signed)
-----   Message from Harle Battiest, PA-C sent at 11/04/2019  4:13 PM EDT ----- Regarding: Larina Bras analysis result Please let Mrs. Mandile know that her stone was composed of calcium oxalate which is the most common type of stone that people form.   The web site for LithoLink has dietary guides with information on how to prevent stones in the future. ----- Message ----- From: Camille Bal Sent: 11/04/2019   4:05 PM EDT To: Harle Battiest, PA-C

## 2019-11-08 ENCOUNTER — Other Ambulatory Visit: Payer: Self-pay

## 2019-11-08 ENCOUNTER — Other Ambulatory Visit: Payer: Commercial Managed Care - PPO

## 2019-11-12 ENCOUNTER — Other Ambulatory Visit: Payer: Self-pay | Admitting: Urology

## 2019-11-17 ENCOUNTER — Telehealth: Payer: Self-pay | Admitting: Urology

## 2019-11-17 NOTE — Telephone Encounter (Signed)
CALLED PATIENT TO SCHEDULE AND SHE SAID SHE WOULD CALL BACK AFTER SHE HAS HER [place] TO SCHEDULE AN APP FOR BOTH RESULTS AT ONE TIME.   MICHELLE

## 2019-11-18 ENCOUNTER — Ambulatory Visit
Admission: RE | Admit: 2019-11-18 | Discharge: 2019-11-18 | Disposition: A | Discharge status: Home / Self Care | Payer: Commercial Managed Care - PPO | Source: Ambulatory Visit | Attending: Urology | Admitting: Urology

## 2019-11-18 ENCOUNTER — Other Ambulatory Visit: Payer: Self-pay

## 2019-11-18 DIAGNOSIS — N201 Calculus of ureter: Secondary | ICD-10-CM | POA: Diagnosis present

## 2019-11-21 ENCOUNTER — Telehealth: Payer: Self-pay | Admitting: Urology

## 2019-11-21 NOTE — Telephone Encounter (Signed)
Please let Traci Cooper know that her RUS and Litholink results are now available.  She may schedule a virtual or in person appointment to review these results and discuss treatment of her left kidney stone.

## 2019-11-21 NOTE — Progress Notes (Signed)
10/28/2019 8:29 PM   Traci Cooper 1980-10-06 [id number]  Referring provider: Doreene Nest, NP [address] [place],  [place] [address]  Chief Complaint  Patient presents with  . Nephrolithiasis    HPI: Traci Cooper is a 39 year old female who is status post ESWL who presents today for follow.  She underwent right ESWL with Dr. Apolinar Junes for a 5 mm right proximal stone.  Her post procedure course was as expected and uneventful.    She brings in a stone for analysis.    KUB 10/28/2019 right calculus is not visualized.    PMH: Past Medical History:  Diagnosis Date  . Anxiety   . Chicken pox   . Complication of anesthesia    woke up during appendectomy  . GERD (gastroesophageal reflux disease)   . History of kidney stones   . Pneumonia   . Urinary tract infection     Surgical History: Past Surgical History:  Procedure Laterality Date  . APPENDECTOMY  1996 or 1997  . DILATION AND EVACUATION N/A 05/13/2019   Procedure: DILATATION AND EVACUATION;  Surgeon: Christeen Douglas, MD;  Location: [facility] ORS;  Service: Gynecology;  Laterality: N/A;  . EXTRACORPOREAL SHOCK WAVE LITHOTRIPSY Right 10/14/2019   Procedure: EXTRACORPOREAL SHOCK WAVE LITHOTRIPSY (ESWL);  Surgeon: Vanna Scotland, MD;  Location: [facility] ORS;  Service: Urology;  Laterality: Right;    Home Medications:  Allergies as of 10/28/2019      Reactions   Erythromycin Swelling   Tetracyclines & Related Nausea And Vomiting      Medication List       Accurate as of Oct 28, 2019 11:59 PM. If you have any questions, ask your nurse or doctor.        DULoxetine 30 MG capsule Commonly known as: CYMBALTA Take 1 capsule (30 mg total) by mouth every evening. For anxiety.   ondansetron 4 MG disintegrating tablet Commonly known as: ZOFRAN-ODT Take 1 tablet (4 mg total) by mouth every 8 (eight) hours as needed.   oxyCODONE-acetaminophen 5-325 MG tablet Commonly known as: Percocet Take 1 tablet by mouth every 8  (eight) hours as needed for severe pain.   tamsulosin 0.4 MG Caps capsule Commonly known as: FLOMAX Take 1 capsule (0.4 mg total) by mouth daily.       Allergies:  Allergies  Allergen Reactions  . Erythromycin Swelling  . Tetracyclines & Related Nausea And Vomiting    Family History: Family History  Problem Relation Age of Onset  . Stroke Maternal Grandfather   . Hypertension Maternal Grandfather   . Hyperlipidemia Mother   . Hypertension Mother   . Kidney Stones Mother   . Other Father        unknown medical history    Social History:  reports that she has never smoked. She has never used smokeless tobacco. She reports current alcohol use. She reports that she does not use drugs.  ROS: Pertinent ROS in HPI  Physical Exam: BP (!) 147/88   Pulse 94   Ht 5\' 3"  (1.6 m)   Wt 120 lb (54.4 kg)   BMI 21.26 kg/m Constitutional:  Well nourished. Alert and oriented, No acute distress. HEENT: [name] AT, moist mucus membranes.  Trachea midline, no masses. Cardiovascular: No clubbing, cyanosis, or edema. Respiratory: Normal respiratory effort, no increased work of breathing. Neurologic: Grossly intact, no focal deficits, moving all 4 extremities. Psychiatric: Normal mood and affect.  Laboratory Data: Lab Results  Component Value Date  WBC 12.2 (H) 10/10/2019   HGB 12.4 10/10/2019   HCT 39.3 10/10/2019   MCV 80.5 10/10/2019   PLT 305 10/10/2019    Lab Results  Component Value Date   CREATININE 0.58 10/10/2019       Component Value Date/Time   CHOL 251 (H) 02/10/2019 0920   HDL 78.90 02/10/2019 0920   CHOLHDL 3 02/10/2019 0920   VLDL 11.4 02/10/2019 0920   LDLCALC 161 (H) 02/10/2019 0920    Lab Results  Component Value Date   AST 21 04/26/2019   Lab Results  Component Value Date   ALT 19 04/26/2019    Urinalysis    Component Value Date/Time   COLORURINE YELLOW 10/10/2019 1103   APPEARANCEUR HAZY (A) 10/10/2019 1103   LABSPEC 1.025 10/10/2019 1103    PHURINE 6.0 10/10/2019 1103   GLUCOSEU NEGATIVE 10/10/2019 1103   HGBUR MODERATE (A) 10/10/2019 1103   BILIRUBINUR NEGATIVE 10/10/2019 1103   KETONESUR NEGATIVE 10/10/2019 1103   PROTEINUR TRACE (A) 10/10/2019 1103   UROBILINOGEN 1.0 04/07/2008 2226   NITRITE NEGATIVE 10/10/2019 1103   LEUKOCYTESUR NEGATIVE 10/10/2019 1103    I have reviewed the labs.   Pertinent Imaging: CLINICAL DATA:  Kidney stones.  EXAM: ABDOMEN - 1 VIEW  COMPARISON:  10/12/2019  FINDINGS: The previously demonstrated 5 mm radiopaque density at the level of the L3-L4 disc space is no longer well appreciated. Calcifications project over the left kidney and measure up to approximately 5-6 mm. Multiple calcifications project over the patient's pelvis. These are essentially unchanged from the prior study.  IMPRESSION: 1. The previously demonstrated 5 mm radiopaque density at the level of the L3-L4 disc space is no longer well appreciated. 2. Left nephrolithiasis.   Electronically Signed   By: Constance Holster M.D.   On: 10/28/2019 17:07 I have independently reviewed the films.  See HPI.   Assessment & Plan:    1. Ureteral calculus - s/p ESWL - fragment sent for analysis - US RENAL; Future - 24 hour metabolic work up  2. Left renal stone - patient would like this addressed in the future   Return for will call patient once RUS and Litholink are available .  These notes generated with voice recognition software. I apologize for typographical errors.  Zara Council, PA-C  [facility] [address]  [place], [place] [address] [phone]

## 2019-11-23 ENCOUNTER — Other Ambulatory Visit: Payer: Self-pay

## 2019-11-23 ENCOUNTER — Telehealth (INDEPENDENT_AMBULATORY_CARE_PROVIDER_SITE_OTHER): Payer: Commercial Managed Care - PPO | Admitting: Urology

## 2019-11-23 DIAGNOSIS — N200 Calculus of kidney: Secondary | ICD-10-CM

## 2019-11-23 NOTE — Progress Notes (Signed)
This service is provided via telemedicine   No vital signs collected/recorded due to the encounter was a telemedicine visit.     Patient consents to a telephone visit:  For image results with Michiel Cowboy, PA-C    Names of all persons participating in the telemedicine service and their role in the encounter:   Tatjana Dellinger, Teressa Lower, CMA,

## 2019-11-23 NOTE — Progress Notes (Signed)
Could not connect with patient due to conflicting schedules.  Patient notified of results per my chart.

## 2020-02-11 ENCOUNTER — Other Ambulatory Visit: Payer: Self-pay | Admitting: Primary Care

## 2020-02-11 DIAGNOSIS — F411 Generalized anxiety disorder: Secondary | ICD-10-CM

## 2020-04-16 ENCOUNTER — Telehealth: Payer: Self-pay | Admitting: Primary Care

## 2020-04-16 DIAGNOSIS — F411 Generalized anxiety disorder: Secondary | ICD-10-CM

## 2020-04-18 NOTE — Telephone Encounter (Signed)
Last visit with patient was on 04/20/2019 for positive pregnancy test.

## 2020-04-18 NOTE — Telephone Encounter (Signed)
Needs appointment for further refills. It also looks like we sent in 90 capsules in August 2021.

## 2020-04-19 MED ORDER — DULOXETINE HCL 30 MG PO CPEP: ORAL_CAPSULE | ORAL | 0 refills | Status: DC

## 2020-04-19 NOTE — Telephone Encounter (Signed)
Is she calling back to schedule the virtual visit?

## 2020-04-19 NOTE — Telephone Encounter (Signed)
Called patient to schedule appointment. Pt stated she is okay for right now and that she may need it in the next 2+ weeks. Pt moved to  [place] and will have to do virtual. Sending to provider as FYI.

## 2020-04-19 NOTE — Telephone Encounter (Signed)
Noted, 30 day supply sent to pharmacy.

## 2020-04-19 NOTE — Telephone Encounter (Signed)
Yes patient stated she will call back to schedule. Patient wanted to verify with insurance that they would cover visit.

## 2020-05-25 ENCOUNTER — Encounter: Payer: Self-pay | Admitting: Primary Care

## 2020-05-25 ENCOUNTER — Other Ambulatory Visit: Payer: Self-pay

## 2020-05-25 ENCOUNTER — Telehealth (INDEPENDENT_AMBULATORY_CARE_PROVIDER_SITE_OTHER): Payer: 59 | Admitting: Primary Care

## 2020-05-25 DIAGNOSIS — F339 Major depressive disorder, recurrent, unspecified: Secondary | ICD-10-CM

## 2020-05-25 DIAGNOSIS — F411 Generalized anxiety disorder: Secondary | ICD-10-CM | POA: Diagnosis not present

## 2020-05-25 MED ORDER — DULOXETINE HCL 30 MG PO CPEP: ORAL_CAPSULE | ORAL | 1 refills | Status: AC

## 2020-05-25 NOTE — Assessment & Plan Note (Signed)
Appears to be doing well on duloxetine 30 mg daily, continue same. Refills provided.  We did discuss the need for her to find a local PCP in [place], she will do so.  Will provide her with 6 months of medication, this should be enough, she will update if not.

## 2020-05-25 NOTE — Progress Notes (Signed)
Subjective:    Patient ID: Traci Cooper, female    DOB: February 14, 1981, 39 y.o.   MRN: [id number]  HPI  Virtual Visit via Video Note  I connected with Traci Cooper on 05/25/20 at  7:40 AM EST by a video enabled telemedicine application and verified that I am speaking with the correct person using two identifiers.  Location: Patient: Home Provider: Office Participants: Patient and myself   I discussed the limitations of evaluation and management by telemedicine and the availability of in person appointments. The patient expressed understanding and agreed to proceed.  History of Present Illness:  Traci Cooper is a 39 year old female with a history of MDD, GAD who presents today for follow up and medication refills. She's actually moved back to [place].  Currently managed on duloxetine 30 mg for which she's been taking for years, has been on higher doses in the past, doing well on 30 mg at this point. She is planning on establishing with a local primary care provider, is working on this now.  She has no problems with her current dose of duloxetine which has been helpful during her move to [place]. She is adjusting okay. Denies concerns today. Is needing refills.    Observations/Objective:  Alert and oriented. Appears well, not sickly. No distress. Speaking in complete sentences.   Assessment and Plan:  See problem based charting.  Follow Up Instructions:  Continue duloxetine (Cymbalta) 30 mg daily for anxiety and depression.   Take care and don't hesitate to reach out if you need something! Mayra Reel, NP-C    I discussed the assessment and treatment plan with the patient. The patient was provided an opportunity to ask questions and all were answered. The patient agreed with the plan and demonstrated an understanding of the instructions.   The patient was advised to call back or seek an in-person evaluation if the symptoms worsen or if the condition fails to improve as  anticipated.    Doreene Nest, NP    Review of Systems  Respiratory: Negative for shortness of breath.   Cardiovascular: Negative for chest pain.  Psychiatric/Behavioral: Positive for sleep disturbance. The patient is not nervous/anxious.        See HPI       Past Medical History:  Diagnosis Date   Anxiety    Chicken pox    Complication of anesthesia    woke up during appendectomy   GERD (gastroesophageal reflux disease)    History of kidney stones    Pneumonia    Urinary tract infection      Social History   Socioeconomic History   Marital status: Media planner    Spouse name: Not on file   Number of children: Not on file   Years of education: Not on file   Highest education level: Not on file  Occupational History   Not on file  Tobacco Use   Smoking status: Never Smoker   Smokeless tobacco: Never Used  Vaping Use   Vaping Use: Never used  Substance and Sexual Activity   Alcohol use: Yes    Comment: rarely   Drug use: No   Sexual activity: Not on file  Other Topics Concern   Not on file  Social History Narrative   Single.   No children.    Works in [employer] as a Education officer, museum.    Enjoys walking, Diplomatic Services operational officer.    From [place]    Social Determinants of [employer]  Strain:    Difficulty of Paying Living Expenses: Not on file  Food Insecurity:    Worried About Running Out of Food in the Last Year: Not on file   Ran Out of Food in the Last Year: Not on file  Transportation Needs:    Lack of Transportation (Medical): Not on file   Lack of Transportation (Non-Medical): Not on file  Physical Activity:    Days of Exercise per Week: Not on file   Minutes of Exercise per Session: Not on file  Stress:    Feeling of Stress : Not on file  Social Connections:    Frequency of Communication with Friends and Family: Not on file   Frequency of Social Gatherings with Friends and Family: Not on file   Attends  Religious Services: Not on file   Active Member of Clubs or Organizations: Not on file   Attends Banker Meetings: Not on file   Marital Status: Not on file  Intimate Partner Violence:    Fear of Current or Ex-Partner: Not on file   Emotionally Abused: Not on file   Physically Abused: Not on file   Sexually Abused: Not on file    Past Surgical History:  Procedure Laterality Date   APPENDECTOMY  1996 or 69   DILATION AND EVACUATION N/A 05/13/2019   Procedure: DILATATION AND EVACUATION;  Surgeon: Christeen Douglas, MD;  Location: [facility] ORS;  Service: Gynecology;  Laterality: N/A;   EXTRACORPOREAL SHOCK WAVE LITHOTRIPSY Right 10/14/2019   Procedure: EXTRACORPOREAL SHOCK WAVE LITHOTRIPSY (ESWL);  Surgeon: Vanna Scotland, MD;  Location: [facility] ORS;  Service: Urology;  Laterality: Right;    Family History  Problem Relation Age of Onset   Stroke Maternal Grandfather    Hypertension Maternal Grandfather    Hyperlipidemia Mother    Hypertension Mother    Kidney Stones Mother    Other Father        unknown medical history    Allergies  Allergen Reactions   Erythromycin Swelling   Tetracyclines & Related Nausea And Vomiting    No current outpatient medications on file prior to visit.   No current facility-administered medications on file prior to visit.    Ht 5\' 3"  (1.6 m)    Wt 120 lb (54.4 kg)    BMI 21.26 kg/m    Objective:   Physical Exam Constitutional:      General: She is not in acute distress. Pulmonary:     Effort: Pulmonary effort is normal.  Neurological:     Mental Status: She is alert and oriented to person, place, and time.  Psychiatric:        Mood and Affect: Mood normal.            Assessment & Plan:

## 2020-05-25 NOTE — Assessment & Plan Note (Signed)
Appears to be doing well on duloxetine 30 mg daily, continue same. Refills provided.  We did discuss the need for her to find a local PCP in [place], she will do so.  Will provide her with 6 months of medication, this should be enough, she will update if not. 

## 2020-11-30 ENCOUNTER — Encounter: Payer: Self-pay | Admitting: Primary Care

## 2020-11-30 ENCOUNTER — Telehealth (INDEPENDENT_AMBULATORY_CARE_PROVIDER_SITE_OTHER): Payer: 59 | Admitting: Primary Care

## 2020-11-30 DIAGNOSIS — F411 Generalized anxiety disorder: Secondary | ICD-10-CM | POA: Diagnosis not present

## 2020-11-30 DIAGNOSIS — F339 Major depressive disorder, recurrent, unspecified: Secondary | ICD-10-CM

## 2020-11-30 NOTE — Assessment & Plan Note (Signed)
Deteriorated with some suicidal thoughts last week, is feeling better today. Contracts to safety..  Continue Cymbalta 90 mg for now, if no improvement in 2 weeks then consider switching to Zoloft.   Agree to provide work leave, start date of June 8th, return to work on July 6th tentatively. We will follow up virtually again to see how she's doing, may need to extend further.  

## 2020-11-30 NOTE — Progress Notes (Signed)
Patient ID: Josee Speece, female    DOB: May 29, 1981, 40 y.o.   MRN: [id number]  Virtual visit completed through [employer], a video enabled telemedicine application. Due to national recommendations of social distancing due to COVID-19, a virtual visit is felt to be most appropriate for this patient at this time. Reviewed limitations, risks, security and privacy concerns of performing a virtual visit and the availability of in person appointments. I also reviewed that there may be a patient responsible charge related to this service. The patient agreed to proceed.   Patient location: home Provider location: [place] at [facility], office Persons participating in this virtual visit: patient, provider  If any vitals were documented, they were collected by patient at home unless specified below.    Ht 5' 3"  (1.6 m)   Wt 120 lb (54.4 kg)   BMI 21.26 kg/m    CC: Anxiety  Subjective:   HPI: Sreeja Spies is a 40 y.o. female with a history of anxiety and depression presenting on 11/30/2020 for depression and anxiety.  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 She is [redacted] weeks pregnant, she does have a OB/GYN. She is on a waiting list to establish with PCP, appointment is in late July, still resides in [place].  She moved to [facility] in October 2021 and since then has felt anxious and depressed. She's been managed on Cymbalta at 30 mg and 60 mg for years which was effective until she moved to [place] last year.   She developed some suicidal thoughts last week, is feeling better now. Her partner is residing with her. Her OB/GYN has increased her Cymbalta to 90 mg as of 10 days ago and added hydroxyzine 25 mg. She has been set up with a therapist, has met a few times without improvement.   She is  requesting unpaid leave but HR is wanting her to take short term disability but she doesn't have a PCP in [place]. She's not noticed much improvement since dose increase of Cymbalta to 90. She is requesting a work leave for one month to start. Her last day of work was June 7th.       Relevant past medical, surgical, family and social history reviewed and updated as indicated. Interim medical history since our last visit reviewed. Allergies and medications reviewed and updated. Outpatient Medications Prior to Visit  Medication Sig Dispense Refill   DULoxetine (CYMBALTA) 30 MG capsule TAKE 1 CAPSULE BY MOUTH IN  THE EVENING FOR ANXIETY (Patient taking differently: Take 90 mg by mouth daily. TAKE 1 CAPSULE BY MOUTH IN  THE EVENING FOR ANXIETY) 90 capsule 1   famotidine (PEPCID) 20 MG tablet Take 20 mg by mouth daily.     hydrOXYzine (ATARAX/VISTARIL) 25 MG tablet Take 25-50 mg by mouth 3 (three) times daily as needed.     metoCLOPramide (REGLAN) 10 MG tablet Take 10 mg by mouth every 6 (six) hours as needed.     ondansetron (ZOFRAN-ODT) 4 MG disintegrating tablet Take 4 mg by mouth every 6 (six) hours as needed.     No facility-administered medications prior to visit.     Per HPI unless specifically indicated in ROS section below Review of Systems  Respiratory:  Negative for shortness of breath.   Cardiovascular:  Negative for chest pain.  Psychiatric/Behavioral:  The patient is nervous/anxious.        See HPI  Objective:  Ht 5' 3"  (1.6 m)   Wt 120 lb (54.4 kg)   BMI 21.26 kg/m   Wt Readings from Last 3 Encounters:  11/30/20 120 lb (54.4 kg)  05/25/20 120 lb (54.4 kg)  10/28/19 120 lb (54.4 kg)       Physical exam: Gen: alert, NAD, not ill appearing Pulm: speaks in complete sentences without increased work of breathing Psych: flat affect, normal thought content. Resting.     Results for orders placed or performed during the hospital encounter of 10/14/19  hCG,  quantitative, pregnancy (serum - [facility])  Result Value Ref Range   hCG, Beta Chain, Quant, S <1 <5 mIU/mL   Assessment & Plan:   Problem List Items Addressed This Visit       Other   Episode of recurrent major depressive disorder ([place])    Deteriorated with some suicidal thoughts last week, is feeling better today. Contracts to safety..  Continue Cymbalta 90 mg for now, if no improvement in 2 weeks then consider switching to Zoloft.   Agree to provide work leave, start date of June 8th, return to work on July 6th tentatively. We will follow up virtually again to see how she's doing, may need to extend further.        Relevant Medications   hydrOXYzine (ATARAX/VISTARIL) 25 MG tablet   GAD (generalized anxiety disorder)    Deteriorated with some suicidal thoughts last week, is feeling better today. Contracts to safety..  Continue Cymbalta 90 mg for now, if no improvement in 2 weeks then consider switching to Zoloft.   Agree to provide work leave, start date of June 8th, return to work on July 6th tentatively. We will follow up virtually again to see how she's doing, may need to extend further.        Relevant Medications   hydrOXYzine (ATARAX/VISTARIL) 25 MG tablet     No orders of the defined types were placed in this encounter.  No orders of the defined types were placed in this encounter.   I discussed the assessment and treatment plan with the patient. The patient was provided an opportunity to ask questions and all were answered. The patient agreed with the plan and demonstrated an understanding of the instructions. The patient was advised to call back or seek an in-person evaluation if the symptoms worsen or if the condition fails to improve as anticipated.  Follow up plan:  Continue taking Cymbalta 90 mg for now, update me in a few weeks as discussed.  We will place you on leave for 1 month, please set up an appointment to meet with me around that time.  It was a  pleasure to see you today! Allie Bossier, NP-C   Pleas Koch, NP

## 2020-11-30 NOTE — Assessment & Plan Note (Signed)
Deteriorated with some suicidal thoughts last week, is feeling better today. Contracts to safety..  Continue Cymbalta 90 mg for now, if no improvement in 2 weeks then consider switching to Zoloft.   Agree to provide work leave, start date of June 8th, return to work on July 6th tentatively. We will follow up virtually again to see how she's doing, may need to extend further.

## 2020-11-30 NOTE — Patient Instructions (Signed)
Continue taking Cymbalta 90 mg for now, update me in a few weeks as discussed.  We will place you on leave for 1 month, please set up an appointment to meet with me around that time.  It was a pleasure to see you today! Mayra Reel, NP-C

## 2020-12-07 ENCOUNTER — Telehealth: Payer: Self-pay | Admitting: Primary Care

## 2020-12-07 NOTE — Telephone Encounter (Signed)
Received STD paperwork. In Traci Cooper's box. EM

## 2020-12-07 NOTE — Telephone Encounter (Signed)
Request sent to medical records for office notes, etc required for short term disability

## 2020-12-07 NOTE — Telephone Encounter (Signed)
Semi completed  short term disability paperwork placed in PCP's inbox for review, completion, sign and date

## 2020-12-08 NOTE — Telephone Encounter (Signed)
Completed and placed on Tamera's desk 

## 2020-12-08 NOTE — Telephone Encounter (Signed)
Returned phone call and provided status update- forms completed waiting on PCP to return to me to fax

## 2020-12-08 NOTE — Telephone Encounter (Signed)
[facility] Night - Client Nonclinical Telephone Record AccessNurse Client [facility] Night - Client Client Site [facility] - Night Physician Vernona Rieger - NP Contact Type Call Who Is Calling Physician / Provider / Hospital Call Type Provider Call Message Only Reason for Call Request to send message to Office Initial Comment Caller states he is from [facility] and he faxed a request for medical record on yesterday and he was calling on the status. Caller states his contact phone number is [phone]. Additional Comment Caller states the patient's name is Traci Cooper DOB [birth year]. Disp. Time Disposition Final User 12/07/2020 5:10:37 PM General Information Provided Yes Darrel Hoover Call Closed By: Darrel Hoover Transaction Date/Time: 12/07/2020 5:06:55 PM (ET)

## 2020-12-11 NOTE — Telephone Encounter (Signed)
LVM for pt that paperwork is completed and faxed  Copy mailed to pt  Copy for scan   Copy retained by me

## 2020-12-22 ENCOUNTER — Telehealth (INDEPENDENT_AMBULATORY_CARE_PROVIDER_SITE_OTHER): Payer: 59 | Admitting: Primary Care

## 2020-12-22 DIAGNOSIS — F411 Generalized anxiety disorder: Secondary | ICD-10-CM

## 2020-12-22 DIAGNOSIS — F339 Major depressive disorder, recurrent, unspecified: Secondary | ICD-10-CM

## 2020-12-22 NOTE — Patient Instructions (Signed)
  Continue duloxentine (Cymbalta) 90 mg daily.  Let me know what your HR department says.   It was a pleasure to see you today!

## 2020-12-22 NOTE — Progress Notes (Signed)
Patient ID: Traci Cooper, female    DOB: Oct 12, 1980, 40 y.o.   MRN: [id number]  Virtual visit completed through [employer], a video enabled telemedicine application. Due to national recommendations of social distancing due to COVID-19, a virtual visit is felt to be most appropriate for this patient at this time. Reviewed limitations, risks, security and privacy concerns of performing a virtual visit and the availability of in person appointments. I also reviewed that there may be a patient responsible charge related to this service. The patient agreed to proceed.   Patient location: home Provider location: [place] at [facility], office Persons participating in this virtual visit: patient, provider   If any vitals were documented, they were collected by patient at home unless specified below.    There were no vitals taken for this visit.   CC: Follow up from Depression and medical leave Subjective:   HPI: Traci Cooper is a 40 y.o. female with a history of anxiety and depression presenting on 12/22/2020 for follow up of depression/anxiety and medical leave.     She was last evaluated virtually on 11/30/20 for symptoms of anxiety, stress, depression since moving to [place] in October 2021. Symptoms had progressed recently. Is also in her first trimester of pregnancy. Is trying to find a PCP, appointment scheduled for late July 2022. She does have an OB/GYN who increased her Cymbalta to 90 mg 10 days prior to our visit, had also connected with a therapist.. She also requesting medical leave given her symptoms, this was granted through July 6th.   Since her last visit she's doing moderately better, still has some tough days. Being out of work has helped, she's been more active and was able to visit her mother. She is compliant to her Cymbalta 90 mg. She received a call from HR stating that [employer] declined her claim for being on medical leave. She plans on calling them today to find out  why.   If she can get approved she believes she would benefit from an extension of her medical leave. She has an appointment with a new PCP in late July this year.        Relevant past medical, surgical, family and social history reviewed and updated as indicated. Interim medical history since our last visit reviewed. Allergies and medications reviewed and updated. Outpatient Medications Prior to Visit  Medication Sig Dispense Refill   DULoxetine (CYMBALTA) 30 MG capsule TAKE 1 CAPSULE BY MOUTH IN  THE EVENING FOR ANXIETY (Patient taking differently: Take 90 mg by mouth daily. TAKE 1 CAPSULE BY MOUTH IN  THE EVENING FOR ANXIETY) 90 capsule 1   famotidine (PEPCID) 20 MG tablet Take 20 mg by mouth daily.     hydrOXYzine (ATARAX/VISTARIL) 25 MG tablet Take 25-50 mg by mouth 3 (three) times daily as needed.     metoCLOPramide (REGLAN) 10 MG tablet Take 10 mg by mouth every 6 (six) hours as needed.     ondansetron (ZOFRAN-ODT) 4 MG disintegrating tablet Take 4 mg by mouth every 6 (six) hours as needed.     No facility-administered medications prior to visit.     Per HPI unless specifically indicated in ROS section below Review of Systems  Psychiatric/Behavioral:         See HPI  Objective:  There were no vitals taken for this visit.  Wt Readings from Last 3 Encounters:  11/30/20 120 lb (54.4 kg)  05/25/20 120 lb (54.4 kg)  10/28/19 120 lb (54.4 kg)  Physical exam: Gen: alert, NAD, not ill appearing Pulm: speaks in complete sentences without increased work of breathing Psych: normal mood, normal thought content      Results for orders placed or performed during the hospital encounter of 10/14/19  hCG, quantitative, pregnancy (serum - [facility])  Result Value Ref Range   hCG, Beta Chain, Quant, S <1 <5 mIU/mL   Assessment & Plan:   Problem List Items Addressed This Visit       Other   Episode of recurrent major depressive disorder ([facility])    She appears some better today,  mood and affect is improved.  Continue Cymbalta 90 mg. She will see a new PCP in a few weeks.  Agree to extend medical leave if she's approved. Would extend through July 31st. She will update.        GAD (generalized anxiety disorder)    She appears some better today, mood and affect is improved.  Continue Cymbalta 90 mg. She will see a new PCP in a few weeks.  Agree to extend medical leave if she's approved. Would extend through July 31st. She will update.          No orders of the defined types were placed in this encounter.  No orders of the defined types were placed in this encounter.   I discussed the assessment and treatment plan with the patient. The patient was provided an opportunity to ask questions and all were answered. The patient agreed with the plan and demonstrated an understanding of the instructions. The patient was advised to call back or seek an in-person evaluation if the symptoms worsen or if the condition fails to improve as anticipated.  Follow up plan:  Continue duloxentine (Cymbalta) 90 mg daily.  Let me know what your HR department says.   It was a pleasure to see you today!   Doreene Nest, NP

## 2020-12-22 NOTE — Assessment & Plan Note (Signed)
She appears some better today, mood and affect is improved.  Continue Cymbalta 90 mg. She will see a new PCP in a few weeks.  Agree to extend medical leave if she's approved. Would extend through July 31st. She will update.  

## 2020-12-22 NOTE — Assessment & Plan Note (Signed)
She appears some better today, mood and affect is improved.  Continue Cymbalta 90 mg. She will see a new PCP in a few weeks.  Agree to extend medical leave if she's approved. Would extend through July 31st. She will update.

## 2021-01-02 NOTE — Telephone Encounter (Signed)
Gentleman (no name left) left a v/m that pt was to be off work thru 01/22/21. [employer] had told pt that they have not received any paperwork. Request cb. Also see pt message in this note. Sending note to [facility] CMA.

## 2021-01-03 NOTE — Telephone Encounter (Signed)
Traci Cooper or Tam, can we get this faxed off ASAP? Placed on Traci Cooper's desk

## 2021-01-12 ENCOUNTER — Telehealth: Payer: Self-pay | Admitting: Primary Care

## 2021-01-12 NOTE — Telephone Encounter (Signed)
Darin called in from [facility] and stated that they are missing office notes from June and July . Fax it to [phone]

## 2021-01-15 NOTE — Telephone Encounter (Signed)
Notes faxed to number provided with Claim number on fax cover sheet.

## 2021-01-26 NOTE — Telephone Encounter (Signed)
Traci Cooper, I typed up a note for this patient's medical leave that needs to be faxed ASAP.  See her MyChart message for the claim number and phone number.  I placed the letter on your keyboard.

## 2022-01-11 IMAGING — CT CT RENAL STONE PROTOCOL
1 of 2 series · 15 of 32 positions shown, 19 images · non-contrast
Comparison: None.

CLINICAL DATA: Right flank pain extending of the right pelvic
region. Nausea. Pain worsens after voiding.

EXAM:
CT ABDOMEN AND PELVIS WITHOUT CONTRAST
TECHNIQUE: Multidetector CT imaging of the abdomen and pelvis was performed
following the standard protocol without IV contrast.

[Series 2: axial st · axial · 0.62mm/px · z∈[-1037,-647]mm · 15 of 86 slices shown, 19 images]
[im 4/86  soft-tissue]
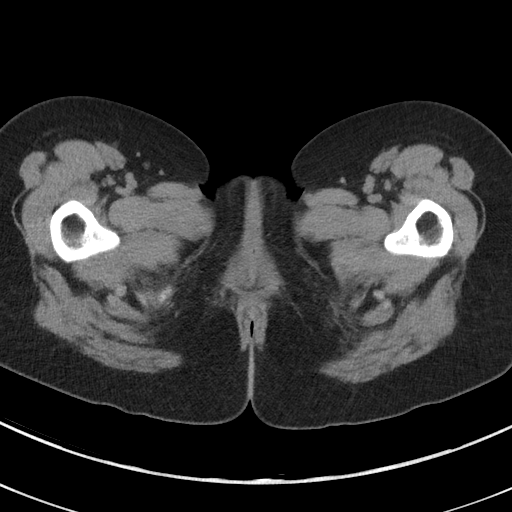
[im 4/86  bone]
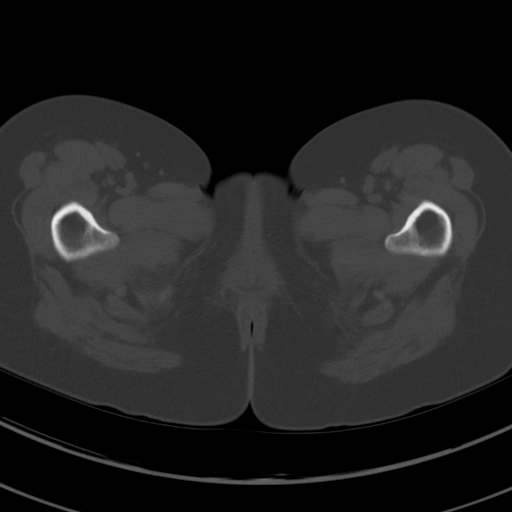
[im 11/86  soft-tissue]
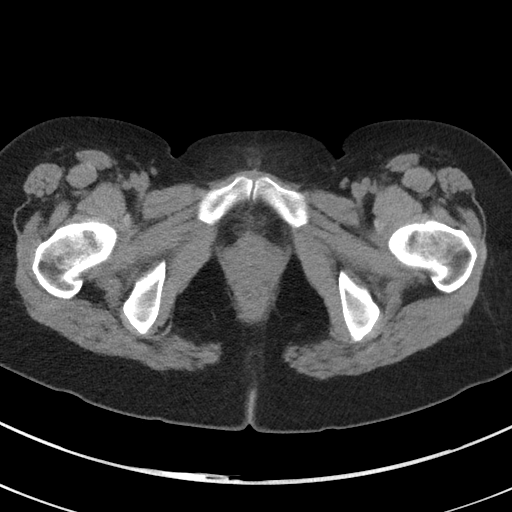
[im 18/86  soft-tissue]
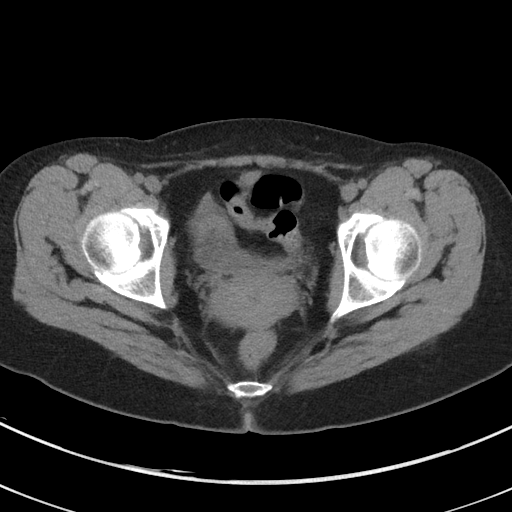
[im 24/86  soft-tissue]
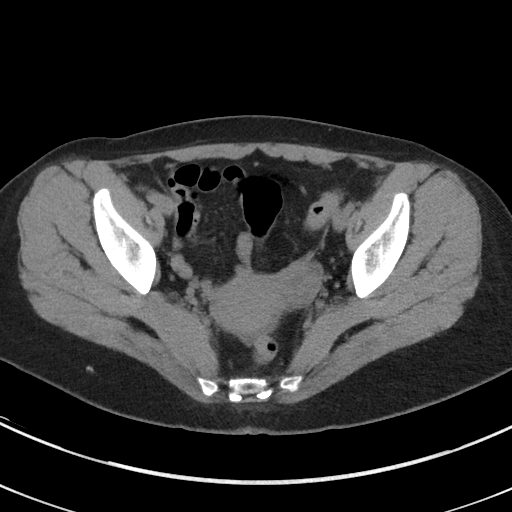
[im 31/86  soft-tissue]
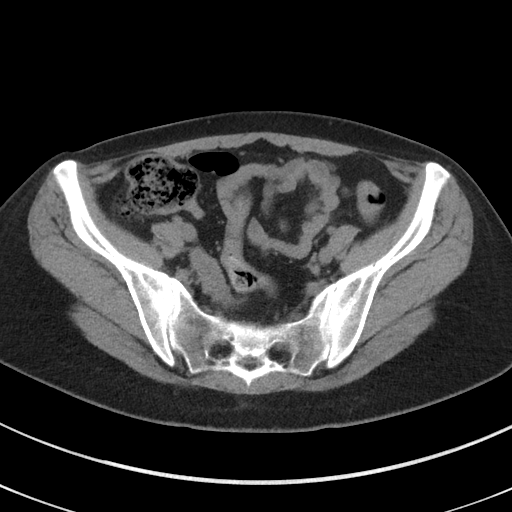
[im 38/86  soft-tissue]
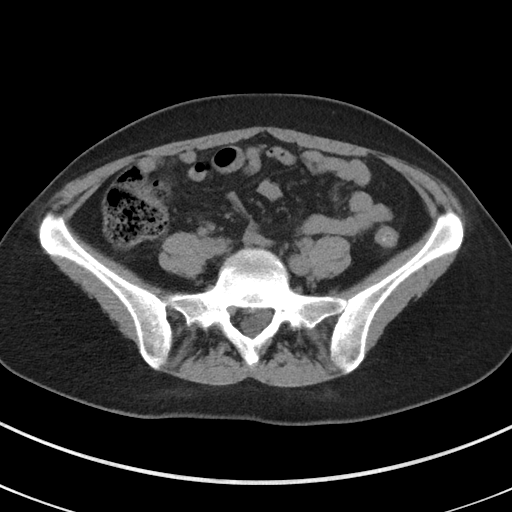
[im 45/86  soft-tissue]
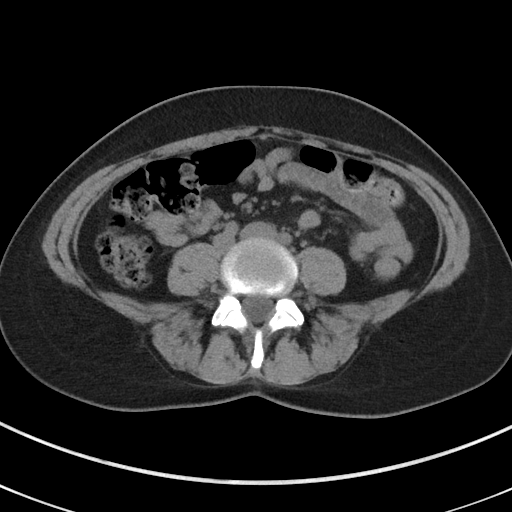
[im 48/86  soft-tissue]
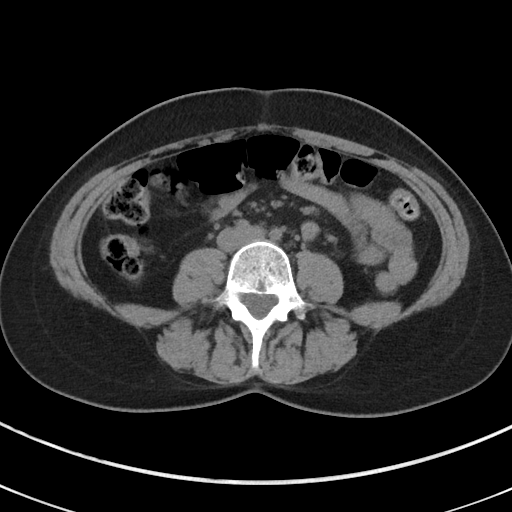
[im 55/86  soft-tissue]
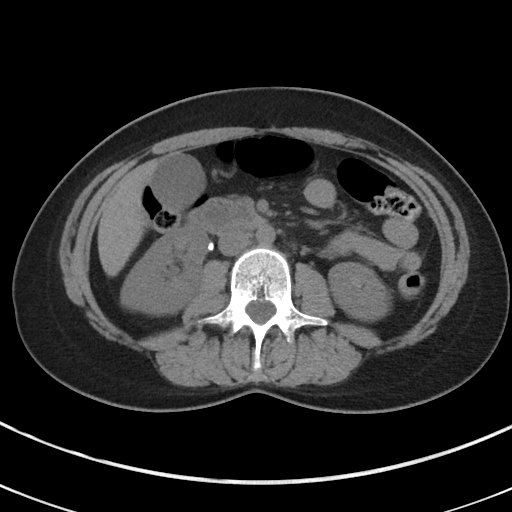
[im 55/86  bone]
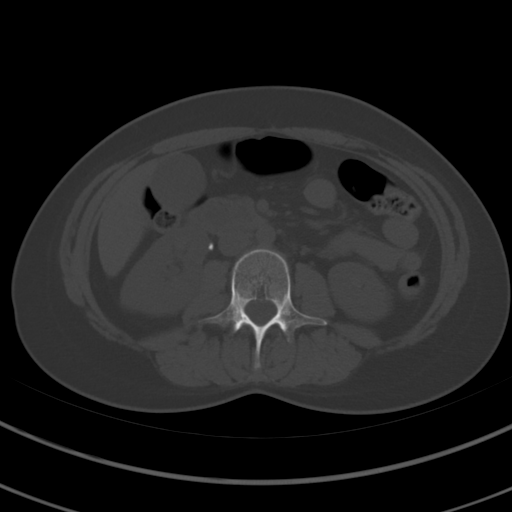
[im 62/86  soft-tissue]
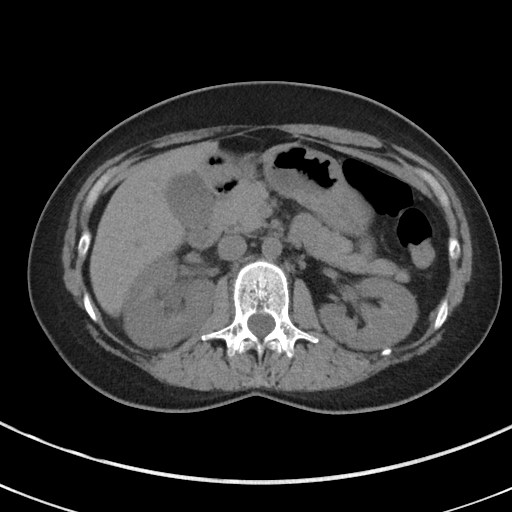
[im 69/86  soft-tissue]
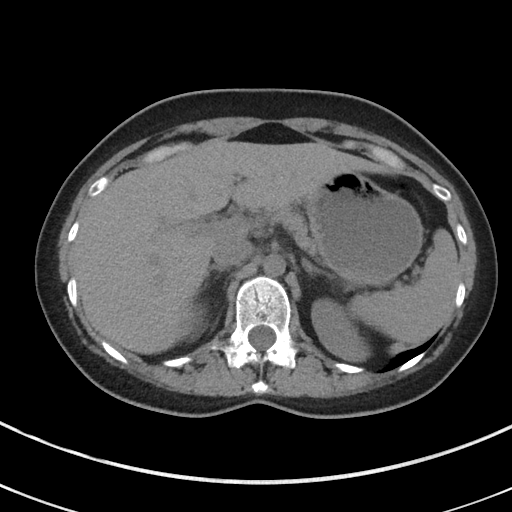
[im 72/86  lung]
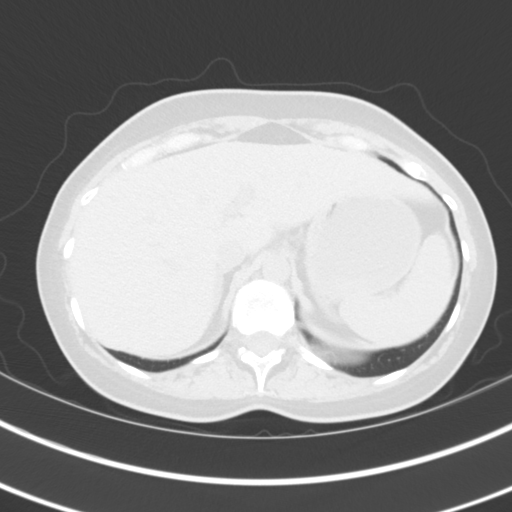
[im 75/86  soft-tissue]
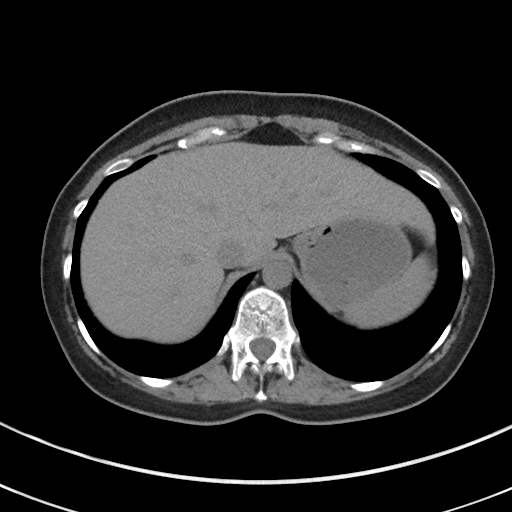
[im 75/86  lung]
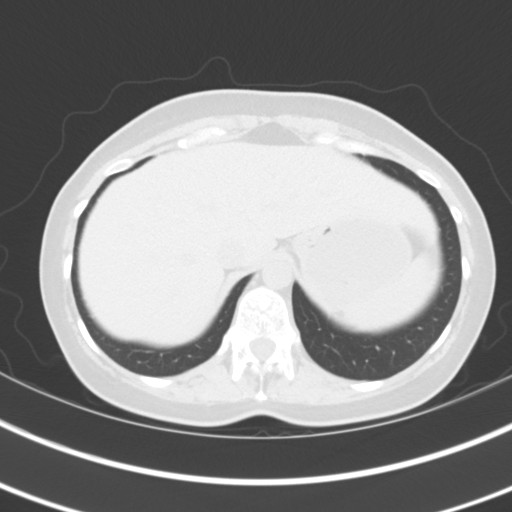
[im 79/86  lung]
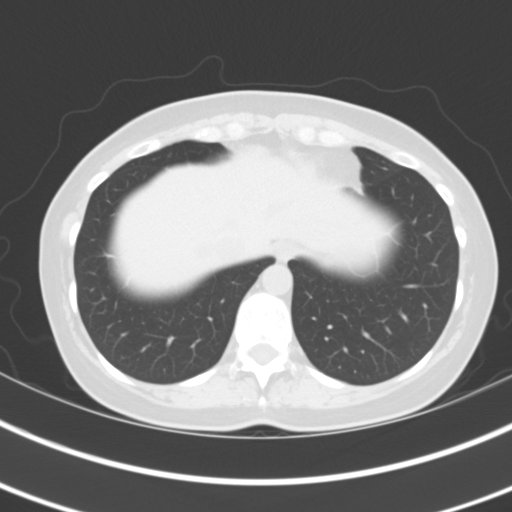
[im 82/86  soft-tissue]
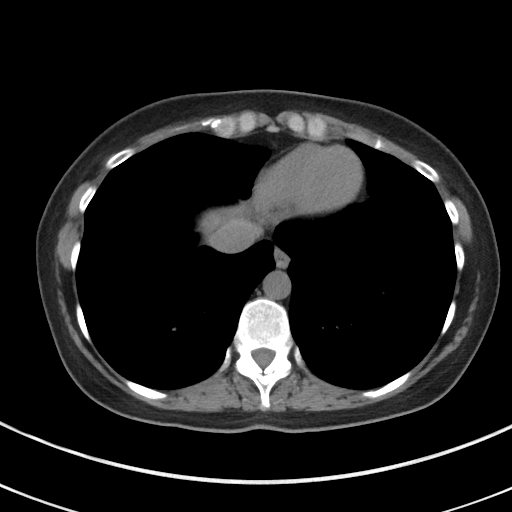
[im 82/86  lung]
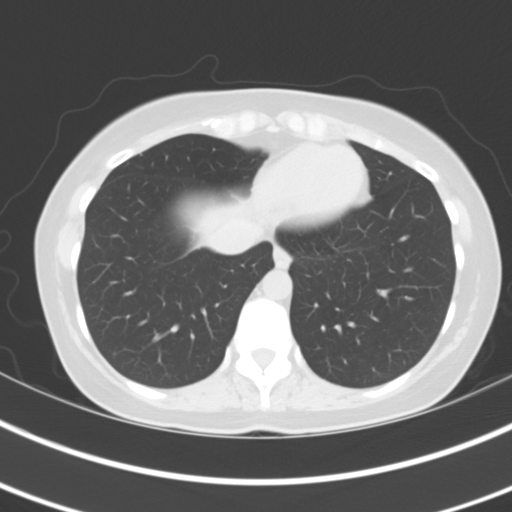

[15 of 32 positions shown; findings below may reference images not displayed]

FINDINGS: Lower chest: Unremarkable

Hepatobiliary: Unremarkable

Pancreas: Unremarkable

Spleen: Unremarkable

Adrenals/Urinary Tract: Mild to moderate right hydronephrosis
related to a 0.5 by 0.3 by 0.4 cm proximal ureteral calculus
immediately adjacent to the UPJ. The right ureter distal to this
point is of normal caliber

There are 5 nonobstructive left renal calculi measuring up to 0.5 cm
in diameter.

Urinary bladder and adrenal glands unremarkable.

Stomach/Bowel: Unremarkable. Appendix absent.

Vascular/Lymphatic: Unremarkable

Reproductive: Unremarkable

Other: Trace free pelvic fluid, likely physiologic.

Musculoskeletal: Small central disc protrusion at L5-S1, without
impingement.
IMPRESSION: 1. Mild to moderate right hydronephrosis related to a 0.5 by 0.3 by
0.4 cm proximal ureteral calculus immediately adjacent to the UPJ.
2. There are 5 nonobstructive left renal calculi measuring up to
cm in diameter.
3. Small central disc protrusion at L5-S1, without impingement.

## 2023-01-29 NOTE — Telephone Encounter (Signed)
Mayra Reel removed as PCP.
